# Patient Record
Sex: Female | Born: 1960 | Race: Black or African American | Hispanic: No | Marital: Single | Smoking: Never smoker
Health system: Southern US, Community
[De-identification: ages and names within clinical notes are randomized; demographics above are authoritative.]

## PROBLEM LIST (undated history)

## (undated) DIAGNOSIS — K209 Esophagitis, unspecified without bleeding: Secondary | ICD-10-CM

## (undated) DIAGNOSIS — F32A Depression, unspecified: Secondary | ICD-10-CM

## (undated) DIAGNOSIS — I10 Essential (primary) hypertension: Secondary | ICD-10-CM

## (undated) HISTORY — PX: TOTAL HIP ARTHROPLASTY: SHX124

---

## 2000-04-27 ENCOUNTER — Emergency Department (HOSPITAL_COMMUNITY): Admission: EM | Admit: 2000-04-27 | Discharge: 2000-04-27 | Payer: Self-pay | Admitting: Emergency Medicine

## 2005-11-05 ENCOUNTER — Emergency Department: Payer: Self-pay | Admitting: Unknown Physician Specialty

## 2008-02-18 ENCOUNTER — Emergency Department: Payer: Self-pay | Admitting: Emergency Medicine

## 2009-08-16 ENCOUNTER — Encounter: Payer: Self-pay | Admitting: Orthopedic Surgery

## 2009-09-04 ENCOUNTER — Encounter: Payer: Self-pay | Admitting: Orthopedic Surgery

## 2009-10-04 ENCOUNTER — Encounter: Payer: Self-pay | Admitting: Orthopedic Surgery

## 2009-10-17 ENCOUNTER — Observation Stay: Payer: Self-pay | Admitting: Internal Medicine

## 2011-11-05 IMAGING — CT CT HEAD WITHOUT CONTRAST
1 of 2 series · 16 of 30 positions shown, 20 images · non-contrast
Comparison: none

REASON FOR EXAM: Headache
COMMENTS:

PROCEDURE:     CT  - CT HEAD WITHOUT CONTRAST  - October 19, 2009 [DATE]
RESULT:     Comparison:  None
TECHNIQUE: Multiple axial images from the foramen magnum to the vertex were
obtained without IV contrast.

[Series 2: without · axial · non-contrast · 0.41mm/px · z∈[+461,+591]mm · 16 of 30 slices shown, 20 images]
[im 2/30  brain]
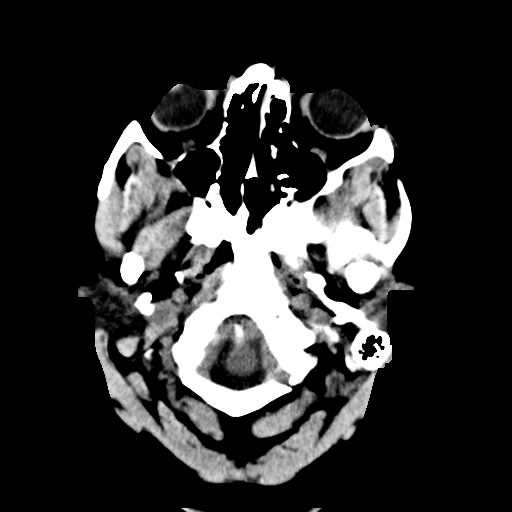
[im 2/30  bone]
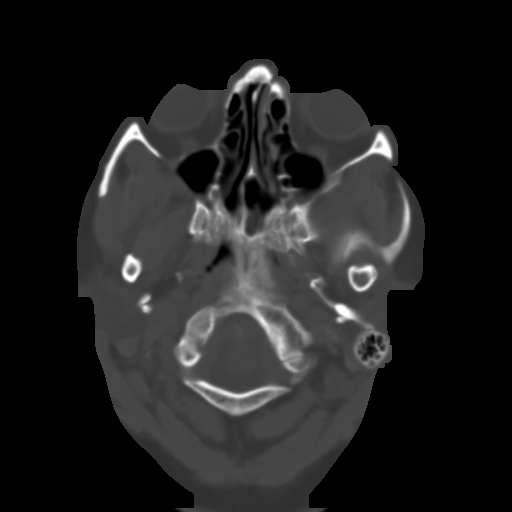
[im 4/30  brain]
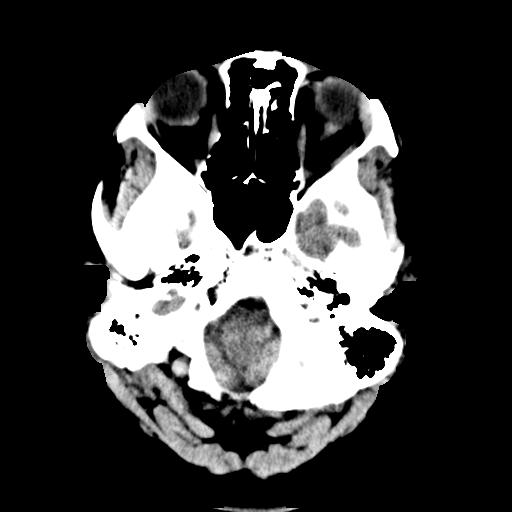
[im 5/30  brain]
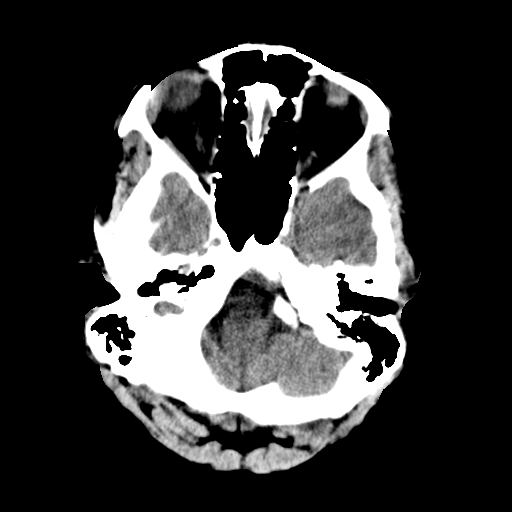
[im 7/30  brain]
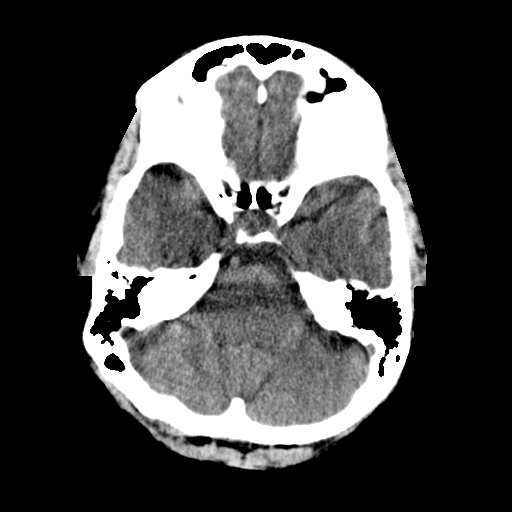
[im 9/30  brain]
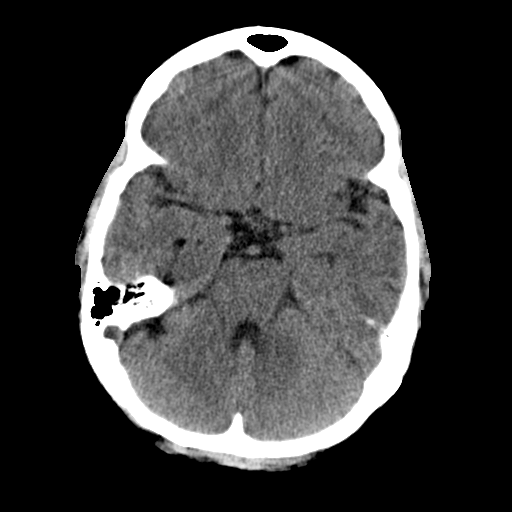
[im 9/30  bone]
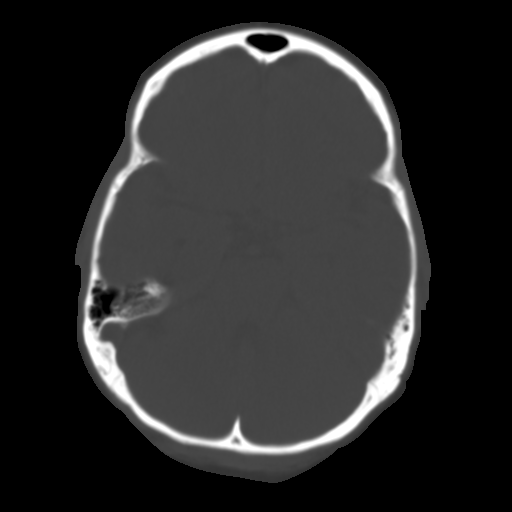
[im 10/30  brain]
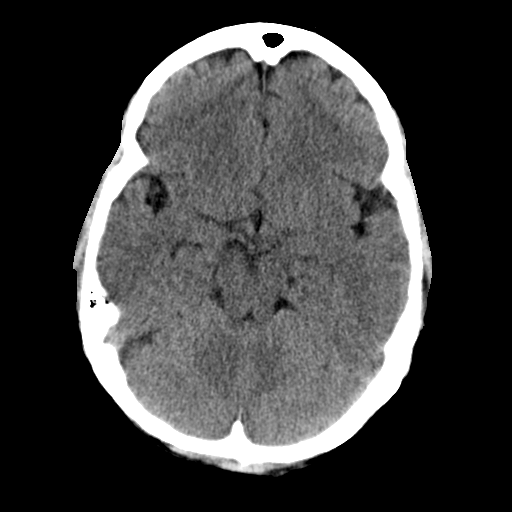
[im 12/30  brain]
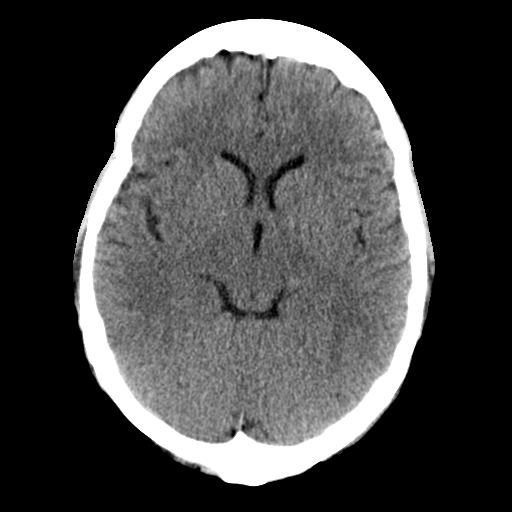
[im 13/30  brain]
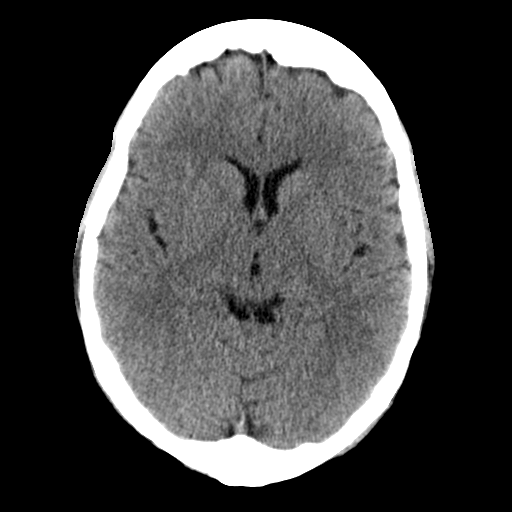
[im 17/30  brain]
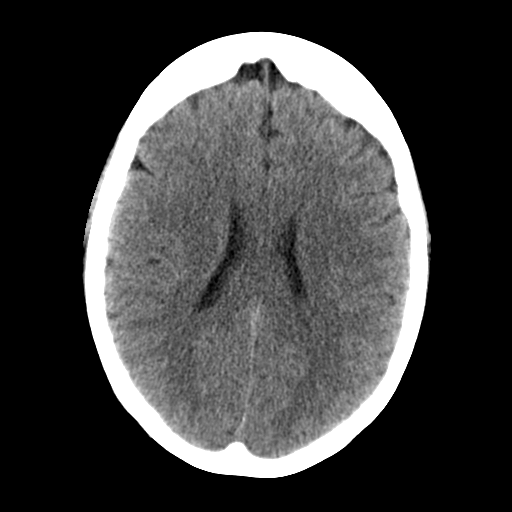
[im 17/30  bone]
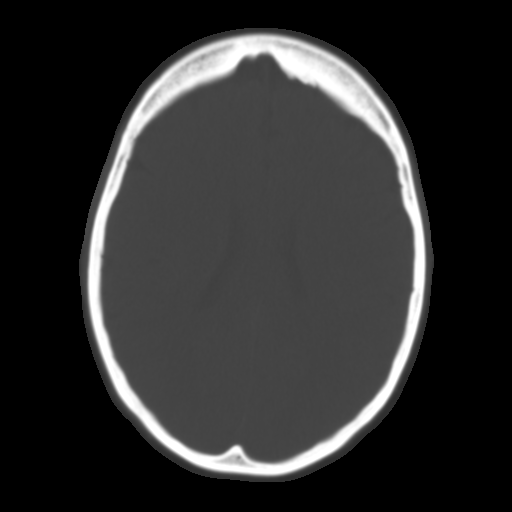
[im 18/30  brain]
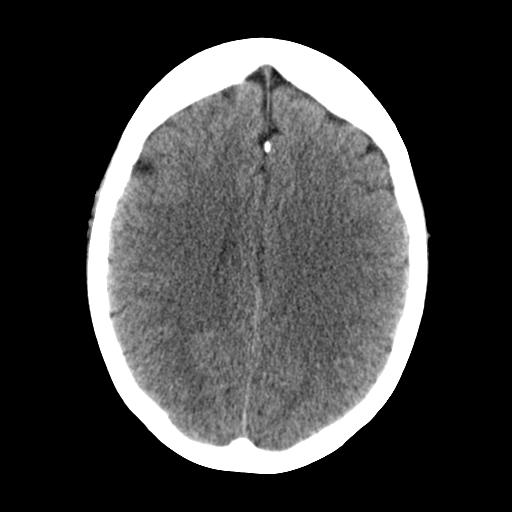
[im 20/30  brain]
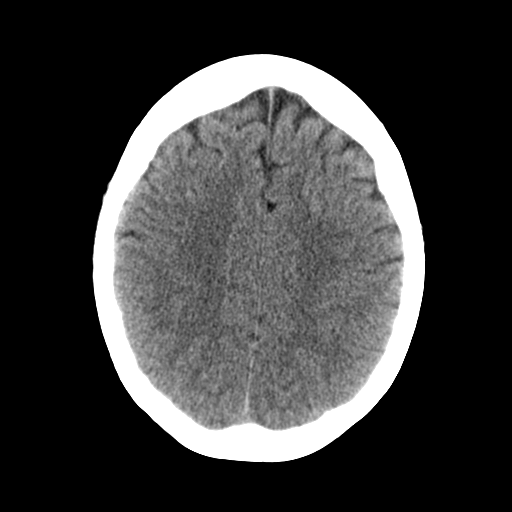
[im 21/30  brain]
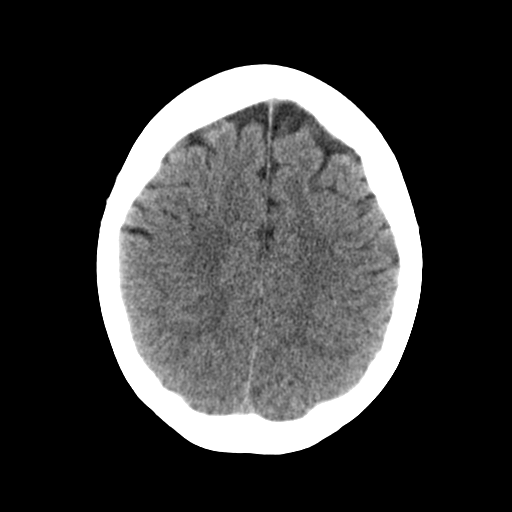
[im 23/30  brain]
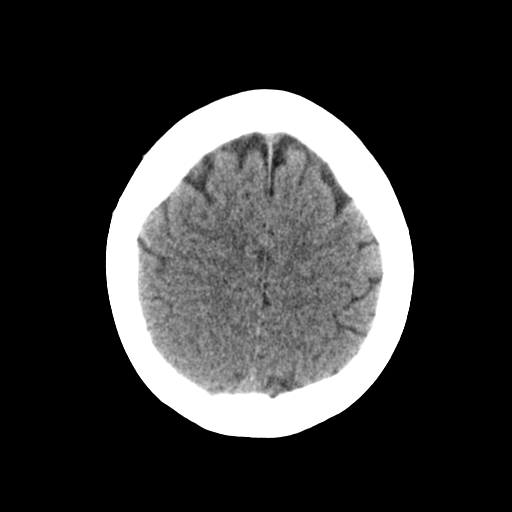
[im 23/30  bone]
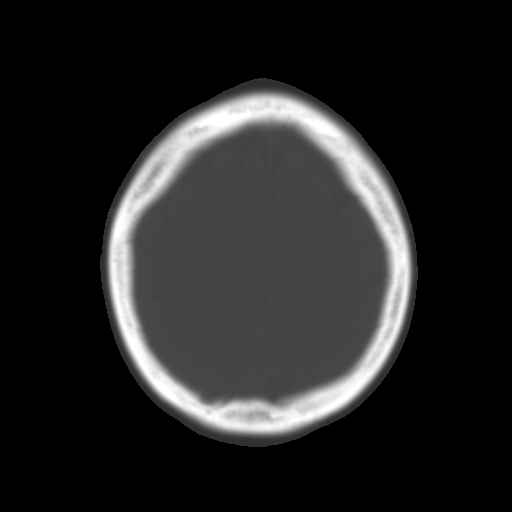
[im 25/30  brain]
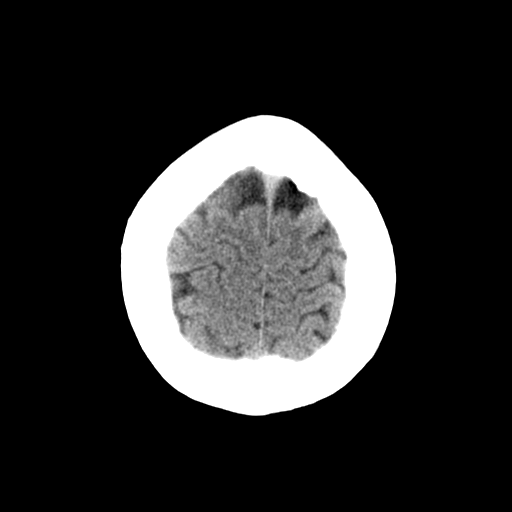
[im 26/30  brain]
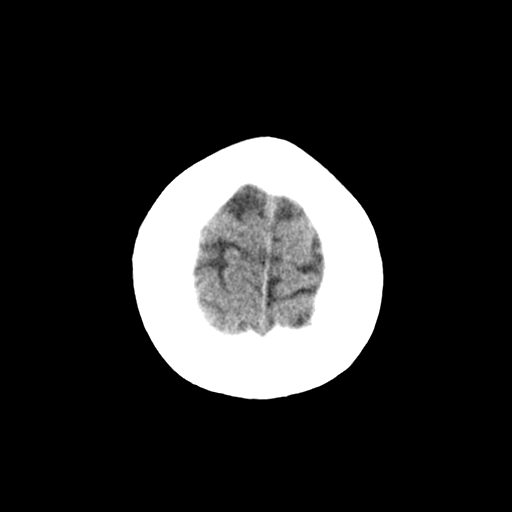
[im 28/30  brain]
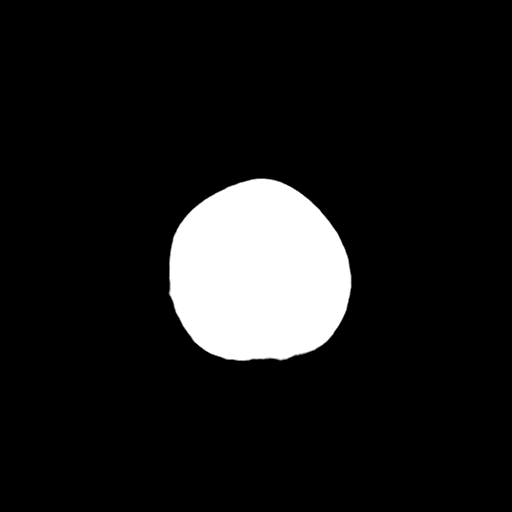

[16 of 30 positions shown; findings below may reference images not displayed]

FINDINGS: There is no evidence of mass effect, midline shift, or extra-axial fluid
collections.  There is no evidence of a space-occupying lesion or
intracranial hemorrhage. There is no evidence of a cortical-based area of
acute infarction.

The ventricles and sulci are appropriate for the patient's age. The basal
cisterns are patent.

Visualized portions of the orbits are unremarkable. The visualized portions
of the paranasal sinuses and mastoid air cells are unremarkable.

The osseous structures are unremarkable.
IMPRESSION: No acute intracranial process.

## 2012-05-05 ENCOUNTER — Emergency Department: Payer: Self-pay | Admitting: Unknown Physician Specialty

## 2012-05-05 LAB — CBC
HCT: 34.9 % — ABNORMAL LOW (ref 35.0–47.0)
HGB: 11.9 g/dL — ABNORMAL LOW (ref 12.0–16.0)
MCH: 34.1 pg — ABNORMAL HIGH (ref 26.0–34.0)
MCHC: 34.2 g/dL (ref 32.0–36.0)
MCV: 100 fL (ref 80–100)
Platelet: 291 10*3/uL (ref 150–440)
RBC: 3.49 10*6/uL — ABNORMAL LOW (ref 3.80–5.20)
WBC: 7.3 10*3/uL (ref 3.6–11.0)

## 2012-05-05 LAB — BASIC METABOLIC PANEL
BUN: 15 mg/dL (ref 7–18)
EGFR (African American): >60
EGFR (Non-African Amer.): >60
Osmolality: 276 (ref 275–301)
Potassium: 3 mmol/L — ABNORMAL LOW (ref 3.5–5.1)
Sodium: 138 mmol/L (ref 136–145)

## 2012-05-05 LAB — TROPONIN I: Troponin-I: <0.02 ng/mL

## 2015-06-05 ENCOUNTER — Emergency Department: Payer: BC Managed Care – PPO

## 2015-06-05 ENCOUNTER — Encounter: Payer: Self-pay | Admitting: *Deleted

## 2015-06-05 ENCOUNTER — Emergency Department
Admission: EM | Admit: 2015-06-05 | Discharge: 2015-06-05 | Disposition: A | Discharge status: Home / Self Care | Payer: BC Managed Care – PPO | Attending: Emergency Medicine | Admitting: Emergency Medicine

## 2015-06-05 ENCOUNTER — Other Ambulatory Visit: Payer: Self-pay

## 2015-06-05 DIAGNOSIS — Z79899 Other long term (current) drug therapy: Secondary | ICD-10-CM | POA: Diagnosis not present

## 2015-06-05 DIAGNOSIS — R079 Chest pain, unspecified: Secondary | ICD-10-CM

## 2015-06-05 DIAGNOSIS — I10 Essential (primary) hypertension: Secondary | ICD-10-CM | POA: Insufficient documentation

## 2015-06-05 DIAGNOSIS — M546 Pain in thoracic spine: Secondary | ICD-10-CM | POA: Insufficient documentation

## 2015-06-05 HISTORY — DX: Esophagitis, unspecified without bleeding: K20.90

## 2015-06-05 HISTORY — DX: Esophagitis, unspecified: K20.9

## 2015-06-05 HISTORY — DX: Essential (primary) hypertension: I10

## 2015-06-05 LAB — FIBRIN DERIVATIVES D-DIMER (ARMC ONLY): FIBRIN DERIVATIVES D-DIMER (ARMC): 583 — AB (ref 0–499)

## 2015-06-05 LAB — BASIC METABOLIC PANEL
ANION GAP: 10 (ref 5–15)
BUN: 15 mg/dL (ref 6–20)
CALCIUM: 9.2 mg/dL (ref 8.9–10.3)
CO2: 24 mmol/L (ref 22–32)
CREATININE: 0.78 mg/dL (ref 0.44–1.00)
Chloride: 103 mmol/L (ref 101–111)
GFR calc Af Amer: >60 mL/min (ref >60)
GFR calc non Af Amer: >60 mL/min (ref >60)
Glucose, Bld: 120 mg/dL — ABNORMAL HIGH (ref 65–99)
Potassium: 3.1 mmol/L — ABNORMAL LOW (ref 3.5–5.1)
Sodium: 137 mmol/L (ref 135–145)

## 2015-06-05 LAB — CBC
HEMATOCRIT: 37.8 % (ref 35.0–47.0)
Hemoglobin: 12.7 g/dL (ref 12.0–16.0)
MCH: 32.4 pg (ref 26.0–34.0)
MCHC: 33.5 g/dL (ref 32.0–36.0)
MCV: 96.8 fL (ref 80.0–100.0)
Platelets: 389 10*3/uL (ref 150–440)
RBC: 3.91 MIL/uL (ref 3.80–5.20)
RDW: 13.1 % (ref 11.5–14.5)
WBC: 7.5 10*3/uL (ref 3.6–11.0)

## 2015-06-05 LAB — TROPONIN I: Troponin I: <0.03 ng/mL (ref <0.031)

## 2015-06-05 MED ORDER — ASPIRIN 81 MG PO CHEW
324.0000 mg | CHEWABLE_TABLET | Freq: Once | ORAL | Status: AC
  Administered 2015-06-05: 162 mg via ORAL
  Filled 2015-06-05: qty 2

## 2015-06-05 MED ORDER — IOHEXOL 350 MG/ML SOLN
75.0000 mL | Freq: Once | INTRAVENOUS | Status: AC | PRN
  Administered 2015-06-05: 75 mL via INTRAVENOUS

## 2015-06-05 MED ORDER — SODIUM CHLORIDE 0.9 % IV BOLUS (SEPSIS): 1000.0000 mL | Freq: Once | INTRAVENOUS | Status: DC

## 2015-06-05 MED ORDER — SODIUM CHLORIDE 0.9 % IV BOLUS (SEPSIS)
500.0000 mL | Freq: Once | INTRAVENOUS | Status: AC
  Administered 2015-06-05: 500 mL via INTRAVENOUS

## 2015-06-05 MED ORDER — CYCLOBENZAPRINE HCL 10 MG PO TABS
10.0000 mg | ORAL_TABLET | Freq: Three times a day (TID) | ORAL | Status: DC | PRN
Start: 2015-06-05 — End: 2015-08-24

## 2015-06-05 NOTE — ED Notes (Signed)
Patient transported to CT 

## 2015-06-05 NOTE — ED Provider Notes (Signed)
[facility] Emergency Department Provider Note  ____________________________________________  Time seen: Approximately 550 PM  I have reviewed the triage vital signs and the nursing notes.   HISTORY  Chief Complaint Chest Pain    HPI Whitney Nichols is a 54 y.o. female with a history of hypertension and esophagitis who presents today with left-sided chest pain that started at 4 AM this morning. She describes the pain as a soreness in her left chest as well as the left thoracic back. She denies any heavy lifting or strenuous activity but says that the pain worsens with movement.  Denies any shortness of breath, diaphoresis, nausea or vomiting. Says has had the same type of pain in the past but has not been evaluated for it. Says that her history is pertinent for her mother who died at 50 suddenly from a massive heart attack.Chest pain is been constant since this morning and moderate. Denies any smoking. Says is on birth control.   Past Medical History  Diagnosis Date  . Hypertension   . Esophagitis     There are no active problems to display for this patient.   Past Surgical History  Procedure Laterality Date  . Total hip arthroplasty      Current Outpatient Rx  Name  Route  Sig  Dispense  Refill  . hydrochlorothiazide (MICROZIDE) 12.5 MG capsule   Oral   Take 12.5 mg by mouth daily.           Allergies Peanut butter flavor  No family history on file.  Social History History  Substance Use Topics  . Smoking status: Never Smoker   . Smokeless tobacco: Not on file  . Alcohol Use: Yes    Review of Systems Constitutional: No fever/chills Eyes: No visual changes. ENT: No sore throat. Cardiovascular: As above Respiratory: Denies shortness of breath. Gastrointestinal: No abdominal pain.  No nausea, no vomiting.  No diarrhea.  No constipation. Genitourinary: Negative for dysuria. Musculoskeletal: Negative for back pain. Skin:  Negative for rash. Neurological: Negative for headaches, focal weakness or numbness.  10-point ROS otherwise negative.  ____________________________________________   PHYSICAL EXAM:  VITAL SIGNS: ED Triage Vitals  Enc Vitals Group     BP 06/05/15 1623 139/85 mmHg     Pulse Rate 06/05/15 1614 108     Resp 06/05/15 1614 16     Temp 06/05/15 1614 98.2 F (36.8 C)     Temp Source 06/05/15 1614 Oral     SpO2 06/05/15 1614 98 %     Weight 06/05/15 1614 200 lb (90.719 kg)     Height 06/05/15 1614  (1.702 m)     Head Cir --      Peak Flow --      Pain Score 06/05/15 1614 6     Pain Loc --      Pain Edu? --      Excl. in GC? --     Constitutional: Alert and oriented. Well appearing and in no acute distress. Eyes: Conjunctivae are normal. PERRL. EOMI. Head: Atraumatic. Nose: No congestion/rhinnorhea. Mouth/Throat: Mucous membranes are moist.  Oropharynx non-erythematous. Neck: No stridor.   Cardiovascular: Normal rate, regular rhythm. Grossly normal heart sounds.  Good peripheral circulation. Chest pain reproducible on palpation. Equal and bilateral radial as well as dorsalis pedis pulses. Respiratory: Normal respiratory effort.  No retractions. Lungs CTAB. Gastrointestinal: Soft and nontender. No distention. No abdominal bruits. No CVA tenderness. Musculoskeletal: No lower extremity tenderness nor edema.  No  joint effusions. Neurologic:  Normal speech and language. No gross focal neurologic deficits are appreciated. No gait instability. Skin:  Skin is warm, dry and intact. No rash noted. Psychiatric: Mood and affect are normal. Speech and behavior are normal.  ____________________________________________   LABS (all labs ordered are listed, but only abnormal results are displayed)  Labs Reviewed  BASIC METABOLIC PANEL - Abnormal; Notable for the following:    Potassium 3.1 (*)    Glucose, Bld 120 (*)    All other components within normal limits  FIBRIN DERIVATIVES  D-DIMER ([facility] ONLY) - Abnormal; Notable for the following:    Fibrin derivatives D-dimer ([facility]) 583 (*)    All other components within normal limits  CBC  TROPONIN I   ____________________________________________  EKG  ED ECG REPORT I, Arelia Longest, the attending physician, personally viewed and interpreted this ECG.   Date: 06/05/2015  EKG Time: 1615  Rate: 106  Rhythm: sinus tachycardia  Axis: Normal axis  Intervals:none  ST&T Change: T-wave inversion in aVF which is unchanged from previous EKG from 05/05/2012. Poor baseline in V3 through V6. Possible new T-wave inversions. We'll repeat EKG.  ED ECG REPORT I, Arelia Longest, the attending physician, personally viewed and interpreted this ECG.   Date: 06/05/2015  EKG Time: 1821  Rate: 67  Rhythm: normal sinus rhythm  Axis: Normal axis  Intervals:none  ST&T Change: T wave inversions in 3 and aVF. Also with T wave inversion in V3. Better baseline than previous EKG taken during this visit. More similar to previous EKGs from 2013. V3 T-wave inversion is new. No ST segment elevations or depressions.  ____________________________________________  RADIOLOGY  No active cardiopulmonary disease. I personally reviewed the chest x-ray.  Negative CT angiography of the chest. ____________________________________________   PROCEDURES    ____________________________________________   INITIAL IMPRESSION / ASSESSMENT AND PLAN / ED COURSE  Pertinent labs & imaging results that were available during my care of the patient were reviewed by me and considered in my medical decision making (see chart for details).  ----------------------------------------- 8:30 PM on 06/05/2015 -----------------------------------------  Patient resting comfortably at this time. Says pain has decreased after aspirin. Chest pain is atypical and has characteristics of musculoskeletal pain. However, due to nonspecific repolarization  abnormalities on the EKG as well as a family history of cardiac disease I feel the patient should follow-up in the office with the cardiologist. I spoke with Dr. Park Breed, who is able to see the patient at 11 AM tomorrow morning. I discussed this with the patient and she says she is able to make the appointment. She will be given a doctor's note to excuse her from work. Heart score of 3 which makes the patient low risk.  ____________________________________________   FINAL CLINICAL IMPRESSION(S) / ED DIAGNOSES  Acute chest pain. Initial visit.    Myrna Blazer, MD 06/05/15 2031

## 2015-06-05 NOTE — ED Notes (Signed)

## 2015-06-05 NOTE — Discharge Instructions (Signed)

## 2015-06-05 NOTE — ED Notes (Signed)
Pt states she was resting last night and had onset of a "soreness" in her chest that has been constant since then. She also feels pain up through the left shoulder pain, associated with nausea. Pt also states that she has had a cough for several days.

## 2015-08-23 ENCOUNTER — Observation Stay
Admission: EM | Admit: 2015-08-23 | Discharge: 2015-08-24 | Disposition: A | Discharge status: Home / Self Care | Payer: BC Managed Care – PPO | Attending: Internal Medicine | Admitting: Internal Medicine

## 2015-08-23 ENCOUNTER — Encounter: Payer: Self-pay | Admitting: *Deleted

## 2015-08-23 DIAGNOSIS — Z9102 Food additives allergy status: Secondary | ICD-10-CM | POA: Diagnosis not present

## 2015-08-23 DIAGNOSIS — Y929 Unspecified place or not applicable: Secondary | ICD-10-CM | POA: Insufficient documentation

## 2015-08-23 DIAGNOSIS — T783XXA Angioneurotic edema, initial encounter: Principal | ICD-10-CM | POA: Diagnosis present

## 2015-08-23 DIAGNOSIS — Z79899 Other long term (current) drug therapy: Secondary | ICD-10-CM | POA: Insufficient documentation

## 2015-08-23 DIAGNOSIS — Z7982 Long term (current) use of aspirin: Secondary | ICD-10-CM | POA: Insufficient documentation

## 2015-08-23 DIAGNOSIS — Y939 Activity, unspecified: Secondary | ICD-10-CM | POA: Diagnosis not present

## 2015-08-23 DIAGNOSIS — X58XXXA Exposure to other specified factors, initial encounter: Secondary | ICD-10-CM | POA: Diagnosis not present

## 2015-08-23 DIAGNOSIS — R1310 Dysphagia, unspecified: Secondary | ICD-10-CM | POA: Diagnosis not present

## 2015-08-23 DIAGNOSIS — I10 Essential (primary) hypertension: Secondary | ICD-10-CM | POA: Insufficient documentation

## 2015-08-23 LAB — COMPREHENSIVE METABOLIC PANEL
ALT: 28 U/L (ref 14–54)
AST: 27 U/L (ref 15–41)
Albumin: 4.1 g/dL (ref 3.5–5.0)
Alkaline Phosphatase: 59 U/L (ref 38–126)
Anion gap: 6 (ref 5–15)
BILIRUBIN TOTAL: 0.5 mg/dL (ref 0.3–1.2)
BUN: 12 mg/dL (ref 6–20)
CO2: 24 mmol/L (ref 22–32)
Calcium: 9.2 mg/dL (ref 8.9–10.3)
Chloride: 106 mmol/L (ref 101–111)
Creatinine, Ser: 0.79 mg/dL (ref 0.44–1.00)
GFR calc Af Amer: >60 mL/min (ref >60)
Glucose, Bld: 111 mg/dL — ABNORMAL HIGH (ref 65–99)
Potassium: 3.6 mmol/L (ref 3.5–5.1)
Sodium: 136 mmol/L (ref 135–145)
TOTAL PROTEIN: 8 g/dL (ref 6.5–8.1)

## 2015-08-23 LAB — CBC
HEMATOCRIT: 36.2 % (ref 35.0–47.0)
Hemoglobin: 12 g/dL (ref 12.0–16.0)
MCH: 32.1 pg (ref 26.0–34.0)
MCHC: 33.1 g/dL (ref 32.0–36.0)
MCV: 97 fL (ref 80.0–100.0)
Platelets: 387 10*3/uL (ref 150–440)
RBC: 3.73 MIL/uL — ABNORMAL LOW (ref 3.80–5.20)
RDW: 13.9 % (ref 11.5–14.5)
WBC: 10 10*3/uL (ref 3.6–11.0)

## 2015-08-23 LAB — PROTIME-INR
INR: 0.9
Prothrombin Time: 12.4 seconds (ref 11.4–15.0)

## 2015-08-23 MED ORDER — FAMOTIDINE IN NACL 20-0.9 MG/50ML-% IV SOLN
20.0000 mg | Freq: Once | INTRAVENOUS | Status: AC
  Administered 2015-08-23: 20 mg via INTRAVENOUS
  Filled 2015-08-23: qty 50

## 2015-08-23 MED ORDER — METHYLPREDNISOLONE SODIUM SUCC 125 MG IJ SOLR
INTRAMUSCULAR | Status: AC
  Administered 2015-08-23: 125 mg via INTRAVENOUS
  Filled 2015-08-23: qty 2

## 2015-08-23 MED ORDER — EPINEPHRINE 0.3 MG/0.3ML IJ SOAJ: 0.3000 mg | Freq: Once | INTRAMUSCULAR | Status: AC

## 2015-08-23 MED ORDER — DIPHENHYDRAMINE HCL 50 MG/ML IJ SOLN
INTRAMUSCULAR | Status: AC
  Administered 2015-08-23: 50 mg via INTRAVENOUS
  Filled 2015-08-23: qty 1

## 2015-08-23 MED ORDER — METHYLPREDNISOLONE SODIUM SUCC 125 MG IJ SOLR
125.0000 mg | Freq: Once | INTRAMUSCULAR | Status: AC
  Administered 2015-08-23: 125 mg via INTRAVENOUS

## 2015-08-23 MED ORDER — EPINEPHRINE HCL 1 MG/ML IJ SOLN
INTRAMUSCULAR | Status: AC
  Administered 2015-08-23: 0.3 mg
  Filled 2015-08-23: qty 1

## 2015-08-23 MED ORDER — DIPHENHYDRAMINE HCL 50 MG/ML IJ SOLN
50.0000 mg | Freq: Once | INTRAMUSCULAR | Status: AC
  Administered 2015-08-23: 50 mg via INTRAVENOUS

## 2015-08-23 NOTE — ED Notes (Signed)
Pt used a seasoning on a porkchop and chicken.  Tongue began swelling and itching.  Pt has diff swallowing.

## 2015-08-24 ENCOUNTER — Encounter: Payer: Self-pay | Admitting: Internal Medicine

## 2015-08-24 DIAGNOSIS — T783XXA Angioneurotic edema, initial encounter: Secondary | ICD-10-CM | POA: Diagnosis present

## 2015-08-24 LAB — CBC
HEMATOCRIT: 34.5 % — AB (ref 35.0–47.0)
Hemoglobin: 11.7 g/dL — ABNORMAL LOW (ref 12.0–16.0)
MCH: 33.2 pg (ref 26.0–34.0)
MCHC: 33.8 g/dL (ref 32.0–36.0)
MCV: 98.1 fL (ref 80.0–100.0)
Platelets: 342 10*3/uL (ref 150–440)
RBC: 3.51 MIL/uL — ABNORMAL LOW (ref 3.80–5.20)
RDW: 14 % (ref 11.5–14.5)
WBC: 8.9 10*3/uL (ref 3.6–11.0)

## 2015-08-24 LAB — CREATININE, SERUM
Creatinine, Ser: 0.79 mg/dL (ref 0.44–1.00)
GFR calc non Af Amer: >60 mL/min (ref >60)

## 2015-08-24 MED ORDER — FENTANYL CITRATE (PF) 100 MCG/2ML IJ SOLN
25.0000 ug | Freq: Once | INTRAMUSCULAR | Status: AC
  Administered 2015-08-24: 25 ug via INTRAVENOUS

## 2015-08-24 MED ORDER — ONDANSETRON HCL 4 MG PO TABS: 4.0000 mg | ORAL_TABLET | Freq: Four times a day (QID) | ORAL | Status: DC | PRN

## 2015-08-24 MED ORDER — ONDANSETRON HCL 4 MG/2ML IJ SOLN: 4.0000 mg | Freq: Four times a day (QID) | INTRAMUSCULAR | Status: DC | PRN

## 2015-08-24 MED ORDER — ACETAMINOPHEN 325 MG PO TABS
650.0000 mg | ORAL_TABLET | Freq: Four times a day (QID) | ORAL | Status: DC | PRN
  Administered 2015-08-24: 650 mg via ORAL
  Filled 2015-08-24 (×2): qty 2

## 2015-08-24 MED ORDER — HYDROCHLOROTHIAZIDE 12.5 MG PO CAPS
12.5000 mg | ORAL_CAPSULE | Freq: Every day | ORAL | Status: DC
  Administered 2015-08-24: 12.5 mg via ORAL
  Filled 2015-08-24: qty 1

## 2015-08-24 MED ORDER — BUTALBITAL-APAP-CAFFEINE 50-325-40 MG PO TABS
1.0000 | ORAL_TABLET | Freq: Once | ORAL | Status: AC
  Administered 2015-08-24: 1 via ORAL
  Filled 2015-08-24: qty 1

## 2015-08-24 MED ORDER — DIPHENHYDRAMINE HCL 50 MG/ML IJ SOLN: 50.0000 mg | Freq: Four times a day (QID) | INTRAMUSCULAR | Status: DC | PRN

## 2015-08-24 MED ORDER — CYCLOBENZAPRINE HCL 10 MG PO TABS: 10.0000 mg | ORAL_TABLET | Freq: Three times a day (TID) | ORAL | Status: DC | PRN

## 2015-08-24 MED ORDER — FENTANYL CITRATE (PF) 100 MCG/2ML IJ SOLN: 12.5000 ug | Freq: Once | INTRAMUSCULAR | Status: DC

## 2015-08-24 MED ORDER — FENTANYL CITRATE (PF) 100 MCG/2ML IJ SOLN
INTRAMUSCULAR | Status: AC
  Administered 2015-08-24: 25 ug via INTRAVENOUS
  Filled 2015-08-24: qty 2

## 2015-08-24 MED ORDER — ONDANSETRON HCL 4 MG/2ML IJ SOLN
4.0000 mg | Freq: Once | INTRAMUSCULAR | Status: AC
  Administered 2015-08-24: 4 mg via INTRAVENOUS

## 2015-08-24 MED ORDER — OXYCODONE HCL 5 MG PO TABS: 5.0000 mg | ORAL_TABLET | ORAL | Status: DC | PRN

## 2015-08-24 MED ORDER — MORPHINE SULFATE (PF) 2 MG/ML IV SOLN
2.0000 mg | INTRAVENOUS | Status: DC | PRN
  Administered 2015-08-24 (×2): 2 mg via INTRAVENOUS
  Filled 2015-08-24 (×2): qty 1

## 2015-08-24 MED ORDER — ONDANSETRON HCL 4 MG/2ML IJ SOLN
INTRAMUSCULAR | Status: AC
  Administered 2015-08-24: 4 mg via INTRAVENOUS
  Filled 2015-08-24: qty 2

## 2015-08-24 MED ORDER — EPINEPHRINE 0.3 MG/0.3ML IJ SOAJ: 0.3000 mg | Freq: Once | INTRAMUSCULAR | Status: AC

## 2015-08-24 MED ORDER — METHYLPREDNISOLONE SODIUM SUCC 125 MG IJ SOLR
60.0000 mg | INTRAMUSCULAR | Status: DC
  Filled 2015-08-24: qty 2

## 2015-08-24 MED ORDER — HEPARIN SODIUM (PORCINE) 5000 UNIT/ML IJ SOLN
5000.0000 [IU] | Freq: Three times a day (TID) | INTRAMUSCULAR | Status: DC
  Administered 2015-08-24: 5000 [IU] via SUBCUTANEOUS
  Filled 2015-08-24: qty 1

## 2015-08-24 MED ORDER — ACETAMINOPHEN 650 MG RE SUPP: 650.0000 mg | Freq: Four times a day (QID) | RECTAL | Status: DC | PRN

## 2015-08-24 NOTE — Progress Notes (Signed)
sw Dr Elisabeth PigeonVachhani concerning diet orders received

## 2015-08-24 NOTE — H&P (Signed)
[facility] [place] [facility]   PATIENT NAME: Whitney BankerJoyce Creeden    MR#:  [id number]  DATE OF BIRTH:  12-14-1960   DATE OF ADMISSION:  08/23/2015  PRIMARY CARE PHYSICIAN: Elvera Lennoxhristopher Claude Felton   REQUESTING/REFERRING PHYSICIAN: Manson PasseyBrown  CHIEF COMPLAINT:   Chief Complaint  Patient presents with  . Allergic Reaction    HISTORY OF PRESENT ILLNESS:  Whitney Nichols  is a 54 y.o. female with a known history of essential hypertension presenting after an allergic reaction. She states that eating her dinner prepared with "seasoning salt" she began to feel that her tongue was swelling and she also had some difficulty swallowing. States that she was able to tolerate her secretions however given her mild symptoms with tongue swelling she presented to the Hospital for further workup and evaluation. In the emergency department she was treated appropriately for an allergic reaction versus symptoms have improved somewhat but still present.  PAST MEDICAL HISTORY:   Past Medical History  Diagnosis Date  . Hypertension   . Esophagitis     PAST SURGICAL HISTORY:   Past Surgical History  Procedure Laterality Date  . Total hip arthroplasty      SOCIAL HISTORY:   Social History  Substance Use Topics  . Smoking status: Never Smoker   . Smokeless tobacco: Not on file  . Alcohol Use: Yes    FAMILY HISTORY:   Family History  Problem Relation Age of Onset  . Hypertension Other     DRUG ALLERGIES:   Allergies  Allergen Reactions  . Peanut Butter Flavor     REVIEW OF SYSTEMS:  REVIEW OF SYSTEMS:  CONSTITUTIONAL: Denies fevers, chills, fatigue, weakness.  EYES: Denies blurred vision, double vision, or eye pain.  EARS, NOSE, THROAT: Denies tinnitus, ear pain, hearing loss. Positive difficulty swallowing RESPIRATORY: denies cough, shortness of breath, wheezing  CARDIOVASCULAR: Denies chest pain, palpitations, edema.  GASTROINTESTINAL: Denies nausea,  vomiting, diarrhea, abdominal pain.  GENITOURINARY: Denies dysuria, hematuria.  ENDOCRINE: Denies nocturia or thyroid problems. HEMATOLOGIC AND LYMPHATIC: Denies easy bruising or bleeding.  SKIN: Denies rash or lesions.  MUSCULOSKELETAL: Denies pain in neck, back, shoulder, knees, hips, or further arthritic symptoms.  NEUROLOGIC: Denies paralysis, paresthesias.  PSYCHIATRIC: Denies anxiety or depressive symptoms. Otherwise full review of systems performed by me is negative.   MEDICATIONS AT HOME:   Prior to Admission medications   Medication Sig Start Date End Date Taking? Authorizing Provider  cyclobenzaprine (FLEXERIL) 10 MG tablet Take 1 tablet (10 mg total) by mouth 3 (three) times daily as needed for muscle spasms. 06/05/15   Myrna Blazeravid Matthew Schaevitz, MD  hydrochlorothiazide (MICROZIDE) 12.5 MG capsule Take 12.5 mg by mouth daily.    Historical Provider, MD      VITAL SIGNS:  Blood pressure 155/93, pulse 102, temperature 98.1 F (36.7 C), temperature source Oral, resp. rate 20, height 5\' 7"  (1.702 m), weight 210 lb (95.255 kg), last menstrual period 07/24/2015, SpO2 100 %.  PHYSICAL EXAMINATION:  VITAL SIGNS: Filed Vitals:   08/24/15 0000  BP: 155/93  Pulse: 102  Temp:   Resp:    GENERAL:53 y.o.female currently in mild acute distress.  HEAD: Normocephalic, atraumatic.  EYES: Pupils equal, round, reactive to light. Extraocular muscles intact. No scleral icterus.  MOUTH: Moist mucosal membrane. Dentition intact. No abscess noted. Macroglossia still able to visualize back of throat EAR, NOSE, THROAT: Clear without exudates. No external lesions.  NECK: Supple. No thyromegaly. No nodules. No JVD. No stridor PULMONARY:  Clear to ascultation, without wheeze rails or rhonci. No use of accessory muscles, Good respiratory effort. good air entry bilaterally CHEST: Nontender to palpation.  CARDIOVASCULAR: S1 and S2. Regular rate and rhythm. No murmurs, rubs, or gallops. No edema. Pedal  pulses 2+ bilaterally.  GASTROINTESTINAL: Soft, nontender, nondistended. No masses. Positive bowel sounds. No hepatosplenomegaly.  MUSCULOSKELETAL: No swelling, clubbing, or edema. Range of motion full in all extremities.  NEUROLOGIC: Cranial nerves II through XII are intact. No gross focal neurological deficits. Sensation intact. Reflexes intact.  SKIN: No ulceration, lesions, rashes, or cyanosis. Skin warm and dry. Turgor intact.  PSYCHIATRIC: Mood, affect within normal limits. The patient is awake, alert and oriented x 3. Insight, judgment intact.    LABORATORY PANEL:   CBC  Recent Labs Lab 08/23/15 2315  WBC 10.0  HGB 12.0  HCT 36.2  PLT 387   ------------------------------------------------------------------------------------------------------------------  Chemistries   Recent Labs Lab 08/23/15 2315  NA 136  K 3.6  CL 106  CO2 24  GLUCOSE 111*  BUN 12  CREATININE 0.79  CALCIUM 9.2  AST 27  ALT 28  ALKPHOS 59  BILITOT 0.5   ------------------------------------------------------------------------------------------------------------------  Cardiac Enzymes No results for input(s): TROPONINI in the last 168 hours. ------------------------------------------------------------------------------------------------------------------  RADIOLOGY:  No results found.  EKG:   Orders placed or performed during the hospital encounter of 06/05/15  . ED EKG within 10 minutes  . ED EKG within 10 minutes  . EKG    IMPRESSION AND PLAN:   54 year old African-American female history of essential hypertension presenting with allergic reaction  1 angioedema/allergic reaction: Received appropriate treatment, continue steroids/Benadryl, continue to monitor patient will require EpiPen for home usage upon discharge 2. Essential hypertension hydrochlorothiazide 3. Venous herbal embolism prophylactic: Heparin subcutaneous    All the records are reviewed and case discussed with  ED provider. Management plans discussed with the patient, family and they are in agreement.  CODE STATUS: Full  TOTAL TIME TAKING CARE OF THIS PATIENT: 35 minutes.    Zadok Holaway,  Mardi Mainland.D on 08/24/2015 at 12:52 AM  Between 7am to 6pm - Pager - [phone]  After 6pm: House Pager: - [phone]  [facility]  Office  [phone]  CC: Primary care physician; Elvera Lennox

## 2015-08-24 NOTE — ED Provider Notes (Signed)
[facility] Emergency Department Provider Note  ____________________________________________  Time seen: 11:30 PM  I have reviewed the triage vital signs and the nursing notes.   HISTORY  Chief Complaint Allergic Reaction      HPI Whitney Nichols is a 54 y.o. female presents with acute onset of tongue swelling accompanied by difficulty swallowing. Patient however denies any difficulty breathing. Patient states that she's had episodes in the past that are similar to this however not to this severity. She has never seen a physician regarding these symptoms before. Patient able to speak in clearfull sentences     Past Medical History  Diagnosis Date  . Hypertension   . Esophagitis     Patient Active Problem List   Diagnosis Date Noted  . Angioedema 08/24/2015    Past Surgical History  Procedure Laterality Date  . Total hip arthroplasty      Current Outpatient Rx  Name  Route  Sig  Dispense  Refill  . cyclobenzaprine (FLEXERIL) 10 MG tablet   Oral   Take 1 tablet (10 mg total) by mouth 3 (three) times daily as needed for muscle spasms.   15 tablet   1   . hydrochlorothiazide (MICROZIDE) 12.5 MG capsule   Oral   Take 12.5 mg by mouth daily.           Allergies Peanut butter flavor  Family History  Problem Relation Age of Onset  . Hypertension Other     Social History Social History  Substance Use Topics  . Smoking status: Never Smoker   . Smokeless tobacco: None  . Alcohol Use: Yes    Review of Systems  Constitutional: Negative for fever. Eyes: Negative for visual changes. ENT: Negative for sore throat. Positive for tongue swelling Cardiovascular: Negative for chest pain. Respiratory: Negative for shortness of breath. Gastrointestinal: Negative for abdominal pain, vomiting and diarrhea. Genitourinary: Negative for dysuria. Musculoskeletal: Negative for back pain. Skin: Negative for rash. Neurological: Negative for  headaches, focal weakness or numbness.   10-point ROS otherwise negative.  ____________________________________________   PHYSICAL EXAM:  VITAL SIGNS: ED Triage Vitals  Enc Vitals Group     BP 08/23/15 2304 152/96 mmHg     Pulse Rate 08/23/15 2304 104     Resp 08/23/15 2304 20     Temp 08/23/15 2304 98.1 F (36.7 C)     Temp Source 08/23/15 2304 Oral     SpO2 08/23/15 2304 97 %     Weight 08/23/15 2304 210 lb (95.255 kg)     Height 08/23/15 2304  (1.702 m)     Head Cir --      Peak Flow --      Pain Score 08/23/15 2305 10     Pain Loc --      Pain Edu? --      Excl. in GC? --      Constitutional: Alert and oriented. Well appearing and in no distress. Eyes: Conjunctivae are normal. PERRL. Normal extraocular movements. ENT   Head: Normocephalic and atraumatic.   Nose: No congestion/rhinnorhea.   Mouth/Throat: Mucous membranes are moist. Markedly swollen tongue with Mallampati score of 4   Neck: No stridor. Hematological/Lymphatic/Immunilogical: No cervical lymphadenopathy. Cardiovascular: Normal rate, regular rhythm. Normal and symmetric distal pulses are present in all extremities. No murmurs, rubs, or gallops. Respiratory: Normal respiratory effort without tachypnea nor retractions. Breath sounds are clear and equal bilaterally. No wheezes/rales/rhonchi. Gastrointestinal: Soft and nontender. No distention. There is no CVA  tenderness. Genitourinary: deferred Musculoskeletal: Nontender with normal range of motion in all extremities. No joint effusions.  No lower extremity tenderness nor edema. Neurologic:  Normal speech and language. No gross focal neurologic deficits are appreciated. Speech is normal.  Skin:  Skin is warm, dry and intact. No rash noted. Psychiatric: Mood and affect are normal. Speech and behavior are normal. Patient exhibits appropriate insight and judgment.  ____________________________________________    LABS (pertinent  positives/negatives)  Labs Reviewed  CBC - Abnormal; Notable for the following:    RBC 3.73 (*)    All other components within normal limits  COMPREHENSIVE METABOLIC PANEL - Abnormal; Notable for the following:    Glucose, Bld 111 (*)    All other components within normal limits  PROTIME-INR  CBC  CREATININE, SERUM  BASIC METABOLIC PANEL  CBC         Critical Care performed: CRITICAL CARE Performed by: Bayard MalesBROWN, [place]   Total critical care time: 60minutes  Critical care time was exclusive of separately billable procedures and treating other patients.  Critical care was necessary to treat or prevent imminent or life-threatening deterioration.  Critical care was time spent personally by me on the following activities: development of treatment plan with patient and/or surrogate as well as nursing, discussions with consultants, evaluation of patient's response to treatment, examination of patient, obtaining history from patient or surrogate, ordering and performing treatments and interventions, ordering and review of laboratory studies, ordering and review of radiographic studies, pulse oximetry and re-evaluation of patient's condition.   ____________________________________________   INITIAL IMPRESSION / ASSESSMENT AND PLAN / ED COURSE  Pertinent labs & imaging results that were available during my care of the patient were reviewed by me and considered in my medical decision making (see chart for details).  Patient received epinephrine 0.3 mg IM as well as Benadryl 50 mg Solu-Medrol 125 mg and Pepcid 20mg  IV.  Approximately 30 minutes after administration of medication patient was reassessed and state that her tongue felt the same however had not worsened. Patient still denies any difficulty breathing however still continues to have difficulty swallowing. Patient still managing oral secretions no active drooling at this time. Patient discussed with Dr. Clint GuyHower for hospital  admission for further evaluation and management.  ____________________________________________   FINAL CLINICAL IMPRESSION(S) / ED DIAGNOSES  Final diagnoses:  Angioedema, initial encounter      Darci Currentandolph N Brantly Kalman, MD 08/24/15 [phone]

## 2015-08-24 NOTE — ED Notes (Signed)
Patient resting comfortably. States no improvement at this time but no worsening symptoms. Neighbor at bedside.

## 2015-08-24 NOTE — Discharge Instructions (Signed)
Follow with PMD in 1-2 weeks, need to get a referral from PMD for Allergy specialist.

## 2015-08-24 NOTE — Discharge Summary (Signed)
[facility] [place] at [facility]   PATIENT NAME: Whitney Nichols    MR#:  [id number]  DATE OF BIRTH:  March 01, 1961  DATE OF ADMISSION:  08/23/2015 ADMITTING PHYSICIAN: Wyatt Haste, MD  DATE OF DISCHARGE: 08/24/2015  PRIMARY CARE PHYSICIAN: Elvera Lennox, DO    ADMISSION DIAGNOSIS:  Angioedema, initial encounter [T78.3XXA]  DISCHARGE DIAGNOSIS:  Principal Problem:   Angioedema   SECONDARY DIAGNOSIS:   Past Medical History  Diagnosis Date  . Hypertension   . Esophagitis     HOSPITAL COURSE:   54 year old African-American female history of essential hypertension presenting with allergic reaction 1 angioedema/allergic reaction: Received appropriate treatment, continue steroids/Benadryl, continue to monitor patient will require EpiPen for home usage upon discharge    Remained stable, swelling subsided and tolerated regular diet.    I educated about use of epi pen , and need to see an allergy specialist with help of PMD at [place].  2. Essential hypertension hydrochlorothiazide  DISCHARGE CONDITIONS:   Stable.  CONSULTS OBTAINED:  Treatment Team:  Wyatt Haste, MD  DRUG ALLERGIES:   Allergies  Allergen Reactions  . Peanut Butter Flavor     DISCHARGE MEDICATIONS:   Current Discharge Medication List    START taking these medications   Details  EPINEPHrine 0.3 mg/0.3 mL IJ SOAJ injection Inject 0.3 mLs (0.3 mg total) into the muscle once. Take injection by your self once, If start to have serious allergic reaction like hives, tongue swelling or trouble breathing. Then need to come to ER. Keep the pen with you all the time. Qty: 1 Device, Refills: 0      CONTINUE these medications which have NOT CHANGED   Details  aspirin EC 325 MG tablet Take 325 mg by mouth daily.    DULoxetine (CYMBALTA) 30 MG capsule Take 30 mg by mouth daily.    hydrochlorothiazide (HYDRODIURIL) 25 MG tablet Take 25 mg by mouth daily.     HYDROcodone-acetaminophen (NORCO) 7.5-325 MG tablet Take 1 tablet by mouth every 6 (six) hours as needed. for pain Refills: 0    ORSYTHIA 0.1-20 MG-MCG tablet Take 1 tablet by mouth daily. Refills: 3    pantoprazole (PROTONIX) 40 MG tablet Take 40 mg by mouth daily. Refills: 2    potassium chloride (KLOR-CON 10) 10 MEQ tablet Take 1 tablet by mouth daily.    Prenatal Vit-Fe Fumarate-FA (PRENATAL PO) Take 1 tablet by mouth daily.    traMADol (ULTRAM) 50 MG tablet Take 50 mg by mouth every 6 (six) hours as needed. for pain Refills: 0    valACYclovir (VALTREX) 500 MG tablet Take 500 mg by mouth daily.      STOP taking these medications     cyclobenzaprine (FLEXERIL) 10 MG tablet      hydrochlorothiazide (MICROZIDE) 12.5 MG capsule          DISCHARGE INSTRUCTIONS:    Follow with PMD in 1-2 weeks.  If you experience worsening of your admission symptoms, develop shortness of breath, life threatening emergency, suicidal or homicidal thoughts you must seek medical attention immediately by calling 911 or calling your MD immediately  if symptoms less severe.  You Must read complete instructions/literature along with all the possible adverse reactions/side effects for all the Medicines you take and that have been prescribed to you. Take any new Medicines after you have completely understood and accept all the possible adverse reactions/side effects.   Please note  You were cared for by a hospitalist  during your hospital stay. If you have any questions about your discharge medications or the care you received while you were in the hospital after you are discharged, you can call the unit and asked to speak with the hospitalist on call if the hospitalist that took care of you is not available. Once you are discharged, your primary care physician will handle any further medical issues. Please note that NO REFILLS for any discharge medications will be authorized once you are discharged, as  it is imperative that you return to your primary care physician (or establish a relationship with a primary care physician if you do not have one) for your aftercare needs so that they can reassess your need for medications and monitor your lab values.    Today   CHIEF COMPLAINT:   Chief Complaint  Patient presents with  . Allergic Reaction    HISTORY OF PRESENT ILLNESS:  Whitney Nichols  is a 54 y.o. female with a known history of essential hypertension presenting after an allergic reaction. She states that eating her dinner prepared with "seasoning salt" she began to feel that her tongue was swelling and she also had some difficulty swallowing. States that she was able to tolerate her secretions however given her mild symptoms with tongue swelling she presented to the Hospital for further workup and evaluation. In the emergency department she was treated appropriately for an allergic reaction versus symptoms have improved somewhat but still present.   VITAL SIGNS:  Blood pressure 134/89, pulse 90, temperature 98.1 F (36.7 C), temperature source Oral, resp. rate 18, height  (1.702 m), weight 95.255 kg (210 lb), last menstrual period 07/24/2015, SpO2 98 %.  I/O:   Intake/Output Summary (Last 24 hours) at 08/24/15 1242 Last data filed at 08/24/15 1000  Gross per 24 hour  Intake    420 ml  Output    450 ml  Net    -30 ml    PHYSICAL EXAMINATION:  GENERAL:  54 y.o.-year-old patient lying in the bed with no acute distress.  EYES: Pupils equal, round, reactive to light and accommodation. No scleral icterus. Extraocular muscles intact.  HEENT: Head atraumatic, normocephalic. Oropharynx and nasopharynx clear.  NECK:  Supple, no jugular venous distention. No thyroid enlargement, no tenderness.  LUNGS: Normal breath sounds bilaterally, no wheezing, rales,rhonchi or crepitation. No use of accessory muscles of respiration.  CARDIOVASCULAR: S1, S2 normal. No murmurs, rubs, or gallops.   ABDOMEN: Soft, non-tender, non-distended. Bowel sounds present. No organomegaly or mass.  EXTREMITIES: No pedal edema, cyanosis, or clubbing.  NEUROLOGIC: Cranial nerves II through XII are intact. Muscle strength 5/5 in all extremities. Sensation intact. Gait not checked.  PSYCHIATRIC: The patient is alert and oriented x 3.  SKIN: No obvious rash, lesion, or ulcer.   DATA REVIEW:   CBC  Recent Labs Lab 08/24/15 0601  WBC 8.9  HGB 11.7*  HCT 34.5*  PLT 342    Chemistries   Recent Labs Lab 08/23/15 2315 08/24/15 0601  NA 136  --   K 3.6  --   CL 106  --   CO2 24  --   GLUCOSE 111*  --   BUN 12  --   CREATININE 0.79 0.79  CALCIUM 9.2  --   AST 27  --   ALT 28  --   ALKPHOS 59  --   BILITOT 0.5  --     Cardiac Enzymes No results for input(s): TROPONINI in the last 168 hours.  Microbiology Results  No results found for this or any previous visit.  RADIOLOGY:  No results found.  Management plans discussed with the patient, family and they are in agreement.  CODE STATUS:     Code Status Orders        Start     Ordered   08/24/15 0040  Full code   Continuous     08/24/15 0040      TOTAL TIME TAKING CARE OF THIS PATIENT: 35 minutes.    Altamese DillingVACHHANI, Khaniya Tenaglia M.D on 08/24/2015 at 12:42 PM  Between 7am to 6pm - Pager - [phone]  After 6pm go to [email or web address] - password [email or web address]  [facility]  Office  [phone]  CC: Primary care physician; Elvera Lennoxhristopher Claude Felton, DO   Note: This dictation was prepared with Dragon dictation along with smaller phrase technology. Any transcriptional errors that result from this process are unintentional.

## 2015-08-24 NOTE — Progress Notes (Signed)
Pt. Alert and oriented. VSS. Pts. Tongue is still swollen but no more swollen than it was when she arrived to unit. Pt. C/o headache. Received morphine in ED for headache with little relief. Oxycodone offered but pt. Wants to wait til it's time for morphine again to take for headache. Independently ambulating around in room.

## 2016-05-05 ENCOUNTER — Encounter: Payer: Self-pay | Admitting: *Deleted

## 2016-05-05 ENCOUNTER — Emergency Department
Admission: EM | Admit: 2016-05-05 | Discharge: 2016-05-05 | Disposition: A | Discharge status: Home / Self Care | Payer: BC Managed Care – PPO | Attending: Emergency Medicine | Admitting: Emergency Medicine

## 2016-05-05 ENCOUNTER — Emergency Department: Payer: BC Managed Care – PPO

## 2016-05-05 DIAGNOSIS — Z79891 Long term (current) use of opiate analgesic: Secondary | ICD-10-CM | POA: Insufficient documentation

## 2016-05-05 DIAGNOSIS — I10 Essential (primary) hypertension: Secondary | ICD-10-CM | POA: Diagnosis not present

## 2016-05-05 DIAGNOSIS — Z7982 Long term (current) use of aspirin: Secondary | ICD-10-CM | POA: Diagnosis not present

## 2016-05-05 DIAGNOSIS — M5442 Lumbago with sciatica, left side: Secondary | ICD-10-CM | POA: Insufficient documentation

## 2016-05-05 DIAGNOSIS — Z79899 Other long term (current) drug therapy: Secondary | ICD-10-CM | POA: Insufficient documentation

## 2016-05-05 DIAGNOSIS — M545 Low back pain: Secondary | ICD-10-CM | POA: Diagnosis present

## 2016-05-05 MED ORDER — CYCLOBENZAPRINE HCL 5 MG PO TABS: 5.0000 mg | ORAL_TABLET | Freq: Three times a day (TID) | ORAL | Status: DC | PRN

## 2016-05-05 MED ORDER — PREDNISONE 10 MG PO TABS: ORAL_TABLET | ORAL | Status: DC

## 2016-05-05 MED ORDER — OXYCODONE-ACETAMINOPHEN 5-325 MG PO TABS
2.0000 | ORAL_TABLET | Freq: Once | ORAL | Status: AC
  Administered 2016-05-05: 2 via ORAL
  Filled 2016-05-05: qty 2

## 2016-05-05 MED ORDER — OXYCODONE-ACETAMINOPHEN 5-325 MG PO TABS: 1.0000 | ORAL_TABLET | ORAL | Status: DC | PRN

## 2016-05-05 NOTE — ED Provider Notes (Signed)
[facility] Emergency Department Provider Note   ____________________________________________  Time seen: Approximately 3:14 PM  I have reviewed the triage vital signs and the nursing notes.   HISTORY  Chief Complaint Back Pain   HPI Whitney Nichols is a 55 y.o. female is here with complaint of back pain for approximately 6 days.Patient states that pain radiates down her left leg. She is not aware of any problems with her back and denies any previous sciatica. 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 Patient has taken "muscle relaxant" and ibuprofen approximate 3 hours ago. Patient drove herself to the emergency room but is currently trying to find a family member to pick her up. Currently she rates her pain as a 10 over 10. She denies any incontinence of urinary bladder. There is no other paresthesias other than the radiation from her back down her left leg to her knee.   Past Medical History  Diagnosis Date  . Hypertension   . Esophagitis     Patient Active Problem List   Diagnosis Date Noted  . Angioedema 08/24/2015    Past Surgical History  Procedure Laterality Date  . Total hip arthroplasty      Current Outpatient Rx  Name  Route  Sig  Dispense  Refill  . aspirin EC 325 MG tablet   Oral   Take 325 mg by mouth daily.         . cyclobenzaprine (FLEXERIL) 5 MG tablet   Oral   Take 1 tablet (5 mg total) by mouth 3 (three) times daily as needed for muscle spasms.   15 tablet   0   . DULoxetine (CYMBALTA) 30 MG capsule   Oral   Take 30 mg by mouth daily.         Marland Kitchen EPINEPHrine 0.3 mg/0.3 mL IJ SOAJ injection   Intramuscular   Inject 0.3 mLs (0.3 mg total) into the muscle once. Take injection by your self once, If start to have serious allergic reaction like hives, tongue swelling or trouble breathing. Then need to come to ER. Keep the pen with you all the time.   1 Device   0   .  hydrochlorothiazide (HYDRODIURIL) 25 MG tablet   Oral   Take 25 mg by mouth daily.         Marland Kitchen HYDROcodone-acetaminophen (NORCO) 7.5-325 MG tablet   Oral   Take 1 tablet by mouth every 6 (six) hours as needed. for pain      0   . ORSYTHIA 0.1-20 MG-MCG tablet   Oral   Take 1 tablet by mouth daily.      3     Dispense as written.   Marland Kitchen oxyCODONE-acetaminophen (PERCOCET) 5-325 MG tablet   Oral   Take 1 tablet by mouth every 4 (four) hours as needed for severe pain.   20 tablet   0   . pantoprazole (PROTONIX) 40 MG tablet   Oral   Take 40 mg by mouth daily.      2   . potassium chloride (KLOR-CON 10) 10 MEQ tablet   Oral   Take 1 tablet by mouth daily.         . predniSONE (DELTASONE) 10 MG tablet      Take 6 tablets  today, on day 2 take 5 tablets, day 3 take 4 tablets, day 4 take 3 tablets, day 5 take  2 tablets and 1 tablet  the last day   21 tablet   0   . Prenatal Vit-Fe Fumarate-FA (PRENATAL PO)   Oral   Take 1 tablet by mouth daily.         . valACYclovir (VALTREX) 500 MG tablet   Oral   Take 500 mg by mouth daily.           Allergies Peanut butter flavor  Family History  Problem Relation Age of Onset  . Hypertension Other     Social History Social History  Substance Use Topics  . Smoking status: Never Smoker   . Smokeless tobacco: None  . Alcohol Use: Yes    Review of Systems Constitutional: No fever/chills Cardiovascular: Denies chest pain. Respiratory: Denies shortness of breath. Gastrointestinal: No abdominal pain.  No nausea, no vomiting.   Musculoskeletal: Positive for low back pain and left hip pain. Skin: Negative for rash. Neurological: Negative for headaches.  Positive for left leg paresthesias.  10-point ROS otherwise negative.  ____________________________________________   PHYSICAL EXAM:  VITAL SIGNS: ED Triage Vitals  Enc Vitals Group     BP 05/05/16 1422 149/99 mmHg     Pulse Rate 05/05/16 1422 116     Resp  05/05/16 1422 18     Temp 05/05/16 1422 98 F (36.7 C)     Temp Source 05/05/16 1422 Oral     SpO2 05/05/16 1422 95 %     Weight 05/05/16 1422 210 lb (95.255 kg)     Height 05/05/16 1422 5\' 7"  (1.702 m)     Head Cir --      Peak Flow --      Pain Score 05/05/16 1419 10     Pain Loc --      Pain Edu? --      Excl. in GC? --     Constitutional: Alert and oriented. Well appearing and in no acute distress. Eyes: Conjunctivae are normal. PERRL. EOMI. Head: Atraumatic. Nose: No congestion/rhinnorhea. Neck: No stridor.   Cardiovascular: Normal rate, regular rhythm. Grossly normal heart sounds.  Good peripheral circulation. Respiratory: Normal respiratory effort.  No retractions. Lungs CTAB. Gastrointestinal: Soft and nontender. No distention.  Musculoskeletal: Examination of the back there is no gross deformity however there is moderate tenderness on palpation of the left sciatic area and paravertebral muscles to the left. Range of motion is moderately restricted secondary to discomfort. Patient is able to stand without assistance. Walking is guarded. Neurologic:  Normal speech and language. No gross focal neurologic deficits are appreciated. Gait was not tested secondary to patient's pain. Skin:  Skin is warm, dry and intact. No rash noted. Psychiatric: Mood and affect are normal. Speech and behavior are normal.  ____________________________________________   LABS (all labs ordered are listed, but only abnormal results are displayed)  Labs Reviewed - No data to display   RADIOLOGY  Lumbar x-ray per radiologist shows vertebral disc height loss is seen at L4-L5 and L5-S1. Facet degeneration is seen at L4-L5 and L5-S1 area. I, Tommi Rumpshonda L Kyandra Mcclaine, personally viewed and evaluated these images (plain radiographs) as part of my medical decision making, as well as reviewing the written report by the radiologist. ____________________________________________   PROCEDURES  Procedure(s)  performed: None  Critical Care performed: No  ____________________________________________   INITIAL IMPRESSION / ASSESSMENT AND PLAN / ED COURSE  Pertinent labs & imaging results that were available during my care of the patient were reviewed by me and considered in my medical decision making (see chart for details).  Patient had adequate family members come to the emergency room to drive her car home. Patient was given 2 Percocet 5/325 prior to discharge and after discussing her back x-rays. Patient was also given a prescription for prednisone 6 day taper and Flexeril 5 mg 3 times a day for 5 days if needed for muscle spasms. Patient is to make an appointment with Dr. Joice LoftsPoggi if any continued problems with her back or sciatic problems. She is also encouraged to use ice or heat to her back as needed. ____________________________________________   FINAL CLINICAL IMPRESSION(S) / ED DIAGNOSES  Final diagnoses:  Left-sided low back pain with left-sided sciatica      NEW MEDICATIONS STARTED DURING THIS VISIT:  New Prescriptions   CYCLOBENZAPRINE (FLEXERIL) 5 MG TABLET    Take 1 tablet (5 mg total) by mouth 3 (three) times daily as needed for muscle spasms.   OXYCODONE-ACETAMINOPHEN (PERCOCET) 5-325 MG TABLET    Take 1 tablet by mouth every 4 (four) hours as needed for severe pain.   PREDNISONE (DELTASONE) 10 MG TABLET    Take 6 tablets  today, on day 2 take 5 tablets, day 3 take 4 tablets, day 4 take 3 tablets, day 5 take  2 tablets and 1 tablet the last day     Note:  This document was prepared using Dragon voice recognition software and may include unintentional dictation errors.    Tommi RumpsRhonda L Lathan Gieselman, PA-C 05/05/16 1645  Sharman CheekPhillip Stafford, MD 05/06/16 [phone]

## 2016-05-05 NOTE — ED Notes (Signed)
Pt verbalized understanding of discharge instructions. NAD at this time. 

## 2016-05-05 NOTE — Discharge Instructions (Signed)
You need to follow up with your primary care doctor or Dr. Joice LoftsPoggi if any continued problems with your back or hip. Read information about sciatica. Use ice or heat to your lower back for comfort. Take Percocet as needed for severe pain, begin prednisone taper starting today, and Flexeril as needed for muscle spasms. Be aware that you cannot take this medication while driving or operating machinery and that this medication can cause drowsiness which will increase your chance for falling.

## 2016-05-05 NOTE — ED Notes (Signed)
Pt complains of back pain since Monday morning.  Pain radiates down left leg.

## 2016-08-05 DIAGNOSIS — R51 Headache: Secondary | ICD-10-CM | POA: Diagnosis present

## 2016-08-05 DIAGNOSIS — Z5321 Procedure and treatment not carried out due to patient leaving prior to being seen by health care provider: Secondary | ICD-10-CM | POA: Insufficient documentation

## 2016-08-05 DIAGNOSIS — Z79899 Other long term (current) drug therapy: Secondary | ICD-10-CM | POA: Diagnosis not present

## 2016-08-05 DIAGNOSIS — I10 Essential (primary) hypertension: Secondary | ICD-10-CM | POA: Diagnosis not present

## 2016-08-06 ENCOUNTER — Encounter: Payer: Self-pay | Admitting: Emergency Medicine

## 2016-08-06 ENCOUNTER — Emergency Department
Admission: EM | Admit: 2016-08-06 | Discharge: 2016-08-06 | Disposition: A | Discharge status: Home / Self Care | Payer: BC Managed Care – PPO | Attending: Emergency Medicine | Admitting: Emergency Medicine

## 2016-08-06 NOTE — ED Triage Notes (Signed)
Pt to triage via w/c, appears uncomfortable; [facility]; was told week ago to stop her meds in prep for such and only to take tramadol and cymbalta; c/o generalized HA accomp by N/V; pt reports hx of same and normally takes her BCP to control them; took ds of her meds tonight PTA

## 2019-02-18 ENCOUNTER — Encounter: Payer: Self-pay | Admitting: Physician Assistant

## 2019-02-18 ENCOUNTER — Emergency Department
Admission: EM | Admit: 2019-02-18 | Discharge: 2019-02-18 | Disposition: A | Discharge status: Home / Self Care | Payer: BC Managed Care – PPO | Attending: Emergency Medicine | Admitting: Emergency Medicine

## 2019-02-18 ENCOUNTER — Other Ambulatory Visit: Payer: Self-pay

## 2019-02-18 DIAGNOSIS — R220 Localized swelling, mass and lump, head: Secondary | ICD-10-CM | POA: Diagnosis present

## 2019-02-18 DIAGNOSIS — I10 Essential (primary) hypertension: Secondary | ICD-10-CM | POA: Insufficient documentation

## 2019-02-18 DIAGNOSIS — T783XXA Angioneurotic edema, initial encounter: Secondary | ICD-10-CM | POA: Diagnosis not present

## 2019-02-18 DIAGNOSIS — Z96649 Presence of unspecified artificial hip joint: Secondary | ICD-10-CM | POA: Insufficient documentation

## 2019-02-18 DIAGNOSIS — Z91010 Allergy to peanuts: Secondary | ICD-10-CM | POA: Insufficient documentation

## 2019-02-18 DIAGNOSIS — Z79899 Other long term (current) drug therapy: Secondary | ICD-10-CM | POA: Diagnosis not present

## 2019-02-18 LAB — BASIC METABOLIC PANEL
Anion gap: 8 (ref 5–15)
BUN: 16 mg/dL (ref 6–20)
CO2: 27 mmol/L (ref 22–32)
Calcium: 9.4 mg/dL (ref 8.9–10.3)
Chloride: 102 mmol/L (ref 98–111)
Creatinine, Ser: 0.78 mg/dL (ref 0.44–1.00)
GFR calc Af Amer: >60 mL/min (ref >60)
GFR calc non Af Amer: >60 mL/min (ref >60)
Glucose, Bld: 182 mg/dL — ABNORMAL HIGH (ref 70–99)
Potassium: 3.3 mmol/L — ABNORMAL LOW (ref 3.5–5.1)
Sodium: 137 mmol/L (ref 135–145)

## 2019-02-18 LAB — CBC
HCT: 35.1 % — ABNORMAL LOW (ref 36.0–46.0)
Hemoglobin: 11.3 g/dL — ABNORMAL LOW (ref 12.0–15.0)
MCH: 31.4 pg (ref 26.0–34.0)
MCHC: 32.2 g/dL (ref 30.0–36.0)
MCV: 97.5 fL (ref 80.0–100.0)
Platelets: 341 10*3/uL (ref 150–400)
RBC: 3.6 MIL/uL — ABNORMAL LOW (ref 3.87–5.11)
RDW: 13.2 % (ref 11.5–15.5)
WBC: 10.6 10*3/uL — ABNORMAL HIGH (ref 4.0–10.5)
nRBC: 0 % (ref 0.0–0.2)

## 2019-02-18 LAB — URINALYSIS, COMPLETE (UACMP) WITH MICROSCOPIC
Bacteria, UA: NONE SEEN
Bilirubin Urine: NEGATIVE
Glucose, UA: NEGATIVE mg/dL
Hgb urine dipstick: NEGATIVE
Ketones, ur: NEGATIVE mg/dL
Leukocytes,Ua: NEGATIVE
Nitrite: NEGATIVE
Protein, ur: NEGATIVE mg/dL
Specific Gravity, Urine: 1.009 (ref 1.005–1.030)
pH: 6 (ref 5.0–8.0)

## 2019-02-18 MED ORDER — SODIUM CHLORIDE 0.9 % IV BOLUS
1000.0000 mL | Freq: Once | INTRAVENOUS | Status: AC
  Administered 2019-02-18: 1000 mL via INTRAVENOUS

## 2019-02-18 MED ORDER — DIPHENHYDRAMINE HCL 50 MG/ML IJ SOLN
25.0000 mg | Freq: Once | INTRAMUSCULAR | Status: AC
  Administered 2019-02-18: 25 mg via INTRAVENOUS
  Filled 2019-02-18: qty 1

## 2019-02-18 MED ORDER — FAMOTIDINE IN NACL 20-0.9 MG/50ML-% IV SOLN
20.0000 mg | Freq: Once | INTRAVENOUS | Status: AC
  Administered 2019-02-18: 20 mg via INTRAVENOUS
  Filled 2019-02-18: qty 50

## 2019-02-18 MED ORDER — PREDNISONE 20 MG PO TABS: 20.0000 mg | ORAL_TABLET | Freq: Two times a day (BID) | ORAL | 0 refills | Status: AC

## 2019-02-18 MED ORDER — DEXAMETHASONE SODIUM PHOSPHATE 10 MG/ML IJ SOLN
10.0000 mg | Freq: Once | INTRAMUSCULAR | Status: AC
  Administered 2019-02-18: 10 mg via INTRAVENOUS
  Filled 2019-02-18: qty 1

## 2019-02-18 MED ORDER — ACETAMINOPHEN 500 MG PO TABS
1000.0000 mg | ORAL_TABLET | Freq: Once | ORAL | Status: AC
  Administered 2019-02-18: 1000 mg via ORAL
  Filled 2019-02-18: qty 2

## 2019-02-18 MED ORDER — EPINEPHRINE 0.3 MG/0.3ML IJ SOAJ
0.3000 mg | Freq: Once | INTRAMUSCULAR | Status: AC
  Administered 2019-02-18: 0.3 mg via INTRAMUSCULAR
  Filled 2019-02-18: qty 0.3

## 2019-02-18 MED ORDER — EPINEPHRINE 0.3 MG/0.3ML IJ SOAJ: 0.3000 mg | Freq: Once | INTRAMUSCULAR | 0 refills | Status: AC

## 2019-02-18 MED ORDER — FAMOTIDINE 20 MG PO TABS: 20.0000 mg | ORAL_TABLET | Freq: Two times a day (BID) | ORAL | 0 refills | Status: AC

## 2019-02-18 NOTE — ED Triage Notes (Signed)
PT arrives via pov after injecting herself with personal epi pen from possible allergic reaction to motrin. Pt reports previously been told "to stay clear from motrin" but reports lower right back pain that caused her to take medication. Pt reports taking motrin at 1630 this am and waking up with a swollen tongue. Pt pale in triage. PT took epi pen 30 minutes prior to triage.

## 2019-02-18 NOTE — ED Provider Notes (Signed)
Ongoing care assigned to Dr. Alphonzo Lemmings.  Patient being observed after receiving epinephrine for suspected allergic reaction to NSAIDs.  She had sublingual edema that is regressing and she is improving.  Wish to observe her until approximately 6 PM, if improved plan will be to discharge with prescription for EpiPen's and steroids.  If worsening or persistent sub-lingular edema, would admit for further treatment and observation   Sharyn Creamer, MD 02/18/19 1521

## 2019-02-18 NOTE — Discharge Instructions (Signed)
You have been treated for an allergic reaction and angioedema. Continue to take OTC Benadryl daily along with the steroid. Follow-up with your provider for ongoing management. Avoid

## 2019-02-18 NOTE — ED Provider Notes (Addendum)
[facility] Emergency Department Provider Note ____________________________________________  Time seen: 1236  I have reviewed the triage vital signs and the nursing notes.  HISTORY  Chief Complaint  Allergic Reaction   HPI Whitney Nichols is a 58 y.o. female presents herself to the ED from home, for evaluation of swelling to the tongue.  Patient with a history of angioedema, hypertension, GERD, and irritable laryngitis, presents after supplementation of her EpiPen.  Patient describes she took a single anti-milligrams dose of ibuprofen about 4 AM this morning, for some right-sided low back pain.  She describes at about 10:00 this morning, she awoke with a sensation of swelling to her tongue.  She administered her EpiPen about 45 minutes prior to this evaluation.  She describes some improvement of her symptoms at this time, but continues to note some swelling to the sublingual region.  She denies any chest pain, shortness of breath, or syncope.  She describes right-sided low back/flank pain without unknown cause but denies any preceding injury, accident, or trauma.  She denies any abdominal pain, hematuria, dysuria, urinary frequency.  She also denies any distal paresthesias, foot drop, or saddle anesthesias.  Past Medical History:  Diagnosis Date  . Esophagitis   . Hypertension     Patient Active Problem List   Diagnosis Date Noted  . Angioedema 08/24/2015    Past Surgical History:  Procedure Laterality Date  . TOTAL HIP ARTHROPLASTY      Prior to Admission medications   Medication Sig Start Date End Date Taking? Authorizing Provider  cyclobenzaprine (FLEXERIL) 5 MG tablet Take 1 tablet (5 mg total) by mouth 3 (three) times daily as needed for muscle spasms. 05/05/16   Tommi Rumps, PA-C  DULoxetine (CYMBALTA) 30 MG capsule Take 30 mg by mouth daily. 08/21/15 08/20/16  [provider]  EPINEPHrine 0.3 mg/0.3 mL IJ SOAJ injection Inject 0.3 mLs  (0.3 mg total) into the muscle once. Take injection by your self once, If start to have serious allergic reaction like hives, tongue swelling or trouble breathing. Then need to come to ER. Keep the pen with you all the time. 08/24/15   Altamese Dilling, MD  EPINEPHrine 0.3 mg/0.3 mL IJ SOAJ injection Inject 0.3 mLs (0.3 mg total) into the muscle once for 1 dose. 02/18/19 02/18/19  Sharyn Creamer, MD  hydrochlorothiazide (HYDRODIURIL) 25 MG tablet Take 25 mg by mouth daily.    [provider]  HYDROcodone-acetaminophen (NORCO) 7.5-325 MG tablet Take 1 tablet by mouth every 6 (six) hours as needed. for pain 07/31/15   [provider]  ORSYTHIA 0.1-20 MG-MCG tablet Take 1 tablet by mouth daily. 07/30/15   [provider]  oxyCODONE-acetaminophen (PERCOCET) 5-325 MG tablet Take 1 tablet by mouth every 4 (four) hours as needed for severe pain. 05/05/16   Tommi Rumps, PA-C  pantoprazole (PROTONIX) 40 MG tablet Take 40 mg by mouth daily. 06/06/15   [provider]  potassium chloride (KLOR-CON 10) 10 MEQ tablet Take 1 tablet by mouth daily. 06/08/15   [provider]  predniSONE (DELTASONE) 10 MG tablet Take 6 tablets  today, on day 2 take 5 tablets, day 3 take 4 tablets, day 4 take 3 tablets, day 5 take  2 tablets and 1 tablet the last day 05/05/16   Tommi Rumps, PA-C  Prenatal Vit-Fe Fumarate-FA (PRENATAL PO) Take 1 tablet by mouth daily.    [provider]  valACYclovir (VALTREX) 500 MG tablet Take 500 mg by mouth  daily. 11/14/12   [provider]    Allergies Motrin [ibuprofen] and Peanut butter flavor  Family History  Problem Relation Age of Onset  . Hypertension Other     Social History Social History   Tobacco Use  . Smoking status: Never Smoker  . Smokeless tobacco: Never Used  Substance Use Topics  . Alcohol use: Yes  . Drug use: No    Review of Systems  Constitutional: Negative for fever. Eyes: Negative for  visual changes. ENT: Negative for sore throat.  Positive for tongue swelling. Cardiovascular: Negative for chest pain. Respiratory: Negative for shortness of breath. Gastrointestinal: Negative for abdominal pain, vomiting and diarrhea. Genitourinary: Negative for dysuria. Musculoskeletal: Positive for back pain. Skin: Negative for rash. Neurological: Negative for headaches, focal weakness or numbness. ____________________________________________  PHYSICAL EXAM:  VITAL SIGNS: ED Triage Vitals  Enc Vitals Group     BP 02/18/19 1238 (!) 150/87     Pulse Rate 02/18/19 1238 (!) 127     Resp 02/18/19 1238 17     Temp 02/18/19 1238 99 F (37.2 C)     Temp Source 02/18/19 1238 Oral     SpO2 02/18/19 1238 95 %     Weight 02/18/19 1235 213 lb (96.6 kg)     Height 02/18/19 1235 5\' 7"  (1.702 m)     Head Circumference --      Peak Flow --      Pain Score 02/18/19 1235 7     Pain Loc --      Pain Edu? --      Excl. in GC? --     Constitutional: Alert and oriented. Well appearing and in no distress. Head: Normocephalic and atraumatic. Eyes: Conjunctivae are normal. PERRL. Normal extraocular movements Nose: No congestion/rhinorrhea/epistaxis. Mouth/Throat: Mucous membranes are moist.  Uvula is midline and tonsils are flat.  Patient with some edema noted to the right-sided sublingual region of the tongue.  The tongue looks unilaterally edematous on the right.  Patient with controls or secretions at this time. Neck: Supple. No thyromegaly. Hematological/Lymphatic/Immunological: No cervical lymphadenopathy. Cardiovascular: Tachy rate, regular rhythm. Normal distal pulses. Respiratory: Normal respiratory effort. No wheezes/rales/rhonchi. Gastrointestinal: Soft and nontender. No distention. No CVA Musculoskeletal: Normal spinal alignment without midline tenderness, spasm, vomiting, or step-off.  Patient tender to palpation to the right lumbar sacral region.  Nontender with normal range of  motion in all extremities.  Neurologic:  Normal gait without ataxia. Normal speech and language.  Negative supine straight leg raise, but maneuver on the right does elicit right-sided low back pain.  No gross focal neurologic deficits are appreciated. Skin:  Skin is warm, dry and intact. No rash noted. Psychiatric: Mood and affect are normal. Patient exhibits appropriate insight and judgment. ____________________________________________   LABS (pertinent positives/negatives) Labs Reviewed  URINALYSIS, COMPLETE (UACMP) WITH MICROSCOPIC - Abnormal; Notable for the following components:      Result Value   Color, Urine YELLOW (*)    APPearance CLEAR (*)    All other components within normal limits  CBC - Abnormal; Notable for the following components:   WBC 10.6 (*)    RBC 3.60 (*)    Hemoglobin 11.3 (*)    HCT 35.1 (*)    All other components within normal limits  BASIC METABOLIC PANEL - Abnormal; Notable for the following components:   Potassium 3.3 (*)    Glucose, Bld 182 (*)    All other components within normal limits  ____________________________________________  PROCEDURES  Procedures  Epinephrine 0.3 ml IM x 1 Decadron 10 mg IVP Benadryl 25 mg IVP Famotidine 20 mg IVPB Acetaminophen 1 g PO ____________________________________________  INITIAL IMPRESSION / ASSESSMENT AND PLAN / ED COURSE  Patient with ED evaluation of angioedema after an oral dose of ibuprofen.  The patient's clinical picture is improving during her tenure here in the ED.  She had a second dose of epinephrine here, and continues to report improvement of her symptoms.  Labs are reassuring at this time.  Patient will be placed in observation status for the next 4 hours, and discharge is expected at that time.  Her epinephrine pen has been refilled at this time.  She is also discharged with prescriptions for prednisone and famotidine.  She is encouraged to take TC Benadryl for additional histamine blockade.  She  was given the option of admission for further observation given the possibility of rebound angioedema.  Patient reports subjective symptom resolution at this time.  She is requesting discharge.  She verbalizes understanding of her discharge instructions and return precautions.  She will follow-up with her allergist for further management. ____________________________________________  FINAL CLINICAL IMPRESSION(S) / ED DIAGNOSES  Final diagnoses:  Angioedema, initial encounter      Lissa HoardMenshew, Jakari Jacot V Bacon, PA-C 02/18/19 1533    Sharyn CreamerQuale, Mark, MD 02/18/19 1541    Karmen StabsMenshew, Charlesetta IvoryJenise V Bacon, PA-C 02/18/19 1807    Sharyn CreamerQuale, Mark, MD 02/19/19 2153

## 2019-02-18 NOTE — ED Provider Notes (Signed)
Vitals:   02/18/19 1238  BP: (!) 150/87  Pulse: (!) 127  Resp: 17  Temp: 99 F (37.2 C)  SpO2: 95%     Personally saw the patient at this time.  She does have sublingual edema, her airway is not obstructed but she definitely has evidence of edema at the base of the tongue.  She reports that her tongue was far more swollen earlier and responded well to epinephrine.  She is still somewhat tachycardic heart rate about 115 at this time, I will give a second dose of epinephrine to see if we can further improve the angioedema located the base of her tongue as this is in her airway and I am concerned that if her symptoms were to worsen that she may need management of her airway.  She does however report that her symptoms are better than they were earlier, but she still clearly does have swelling in the oropharynx and angioedema.     Sharyn Creamer, MD 02/18/19 1258

## 2019-02-18 NOTE — ED Provider Notes (Signed)
Medical screening examination/treatment/procedure(s) were conducted as a shared visit with non-physician practitioner(s) and myself.  I personally evaluated the patient during the encounter.     Sharyn Creamer, MD 02/18/19 1323

## 2019-02-18 NOTE — ED Notes (Signed)
Pt states took motrin at 430am. Awoke feeling like her tongue was swollen around 10am, so took her epi pen approx 30 mins ago. Was told to stay away from motrin d/t to left jaw swelling.

## 2019-02-25 ENCOUNTER — Emergency Department
Admission: EM | Admit: 2019-02-25 | Discharge: 2019-02-25 | Disposition: A | Discharge status: Home / Self Care | Payer: BC Managed Care – PPO | Attending: Emergency Medicine | Admitting: Emergency Medicine

## 2019-02-25 ENCOUNTER — Other Ambulatory Visit: Payer: Self-pay

## 2019-02-25 ENCOUNTER — Encounter: Payer: Self-pay | Admitting: Emergency Medicine

## 2019-02-25 DIAGNOSIS — M545 Low back pain, unspecified: Secondary | ICD-10-CM

## 2019-02-25 DIAGNOSIS — I10 Essential (primary) hypertension: Secondary | ICD-10-CM | POA: Diagnosis not present

## 2019-02-25 DIAGNOSIS — Z79899 Other long term (current) drug therapy: Secondary | ICD-10-CM | POA: Diagnosis not present

## 2019-02-25 DIAGNOSIS — Z96649 Presence of unspecified artificial hip joint: Secondary | ICD-10-CM | POA: Insufficient documentation

## 2019-02-25 LAB — URINALYSIS, COMPLETE (UACMP) WITH MICROSCOPIC
Bacteria, UA: NONE SEEN
Bilirubin Urine: NEGATIVE
Glucose, UA: NEGATIVE mg/dL
Hgb urine dipstick: NEGATIVE
Ketones, ur: NEGATIVE mg/dL
Nitrite: NEGATIVE
Protein, ur: 100 mg/dL — AB
Specific Gravity, Urine: 1.021 (ref 1.005–1.030)
pH: 7 (ref 5.0–8.0)

## 2019-02-25 LAB — GLUCOSE, CAPILLARY: Glucose-Capillary: 131 mg/dL — ABNORMAL HIGH (ref 70–99)

## 2019-02-25 MED ORDER — ONDANSETRON 4 MG PO TBDP
4.0000 mg | ORAL_TABLET | Freq: Once | ORAL | Status: AC
  Administered 2019-02-25: 4 mg via ORAL
  Filled 2019-02-25: qty 1

## 2019-02-25 MED ORDER — OXYCODONE HCL 5 MG PO TABS
5.0000 mg | ORAL_TABLET | Freq: Once | ORAL | Status: AC
  Administered 2019-02-25: 5 mg via ORAL
  Filled 2019-02-25: qty 1

## 2019-02-25 MED ORDER — TIZANIDINE HCL 4 MG PO TABS: 4.0000 mg | ORAL_TABLET | Freq: Three times a day (TID) | ORAL | 0 refills | Status: AC

## 2019-02-25 MED ORDER — HYDROCODONE-ACETAMINOPHEN 5-325 MG PO TABS: 1.0000 | ORAL_TABLET | ORAL | 0 refills | Status: AC | PRN

## 2019-02-25 MED ORDER — HYDROMORPHONE HCL 1 MG/ML IJ SOLN
1.0000 mg | Freq: Once | INTRAMUSCULAR | Status: AC
  Administered 2019-02-25: 14:00:00 1 mg via INTRAMUSCULAR
  Filled 2019-02-25: qty 1

## 2019-02-25 MED ORDER — ORPHENADRINE CITRATE 30 MG/ML IJ SOLN
60.0000 mg | Freq: Two times a day (BID) | INTRAMUSCULAR | Status: DC
  Administered 2019-02-25: 13:00:00 60 mg via INTRAMUSCULAR
  Filled 2019-02-25: qty 2

## 2019-02-25 NOTE — ED Notes (Signed)
Pt alert and oriented X4, active, cooperative, pt in NAD. RR even and unlabored, color WNL.  Pt informed to return if any life threatening symptoms occur.  Discharge and followup instructions reviewed. Ambulates safely. 

## 2019-02-25 NOTE — ED Notes (Signed)
See triage note  Presents with lower back pain  States she moved a dresser last week  States pain started the next day  States pain is mainly on left side now  Denies any urinary sxs'

## 2019-02-25 NOTE — ED Notes (Signed)
Assisted pt to the restroom for a urine sample.

## 2019-02-25 NOTE — ED Provider Notes (Signed)
[facility] Emergency Department Provider Note ____________________________________________  Time seen: Approximately 1:04 PM  I have reviewed the triage vital signs and the nursing notes.   HISTORY  Chief Complaint Back Pain    HPI Whitney Nichols is a 58 y.o. female who presents to the emergency department for evaluation and treatment of low back pain.  She states that last week she moved a dresser away from the wall just the plug according to the outlet behind it.  She states that her back started hurting "all the way across" later that day.  She has been resting, using heating pad, and taking muscle relaxers which seemed to help a little bit until this morning.  She states that the pain woke her from her sleep and is now only on the left side.  Pain does not radiate into the leg.  She has had no recent falls or injuries other than the pain felt after moving a dresser away from the wall.  She has a recent history of angioedema and a remote total hip arthroplasty.  Past Medical History:  Diagnosis Date  . Esophagitis   . Hypertension     Patient Active Problem List   Diagnosis Date Noted  . Angioedema 08/24/2015    Past Surgical History:  Procedure Laterality Date  . TOTAL HIP ARTHROPLASTY      Prior to Admission medications   Medication Sig Start Date End Date Taking? Authorizing Provider  cyclobenzaprine (FLEXERIL) 10 MG tablet Take 10 mg by mouth 3 (three) times daily as needed for muscle spasms.   Yes [provider]  gabapentin (NEURONTIN) 300 MG capsule Take 300 mg by mouth 3 (three) times daily. 07/19/16  Yes [provider]  DULoxetine (CYMBALTA) 30 MG capsule Take 30 mg by mouth daily. 08/21/15 08/20/16  [provider]  EPINEPHrine 0.3 mg/0.3 mL IJ SOAJ injection Inject 0.3 mLs (0.3 mg total) into the muscle once. Take injection by your self once, If start to have serious allergic reaction like hives, tongue swelling  or trouble breathing. Then need to come to ER. Keep the pen with you all the time. 08/24/15   Altamese Dilling, MD  famotidine (PEPCID) 20 MG tablet Take 1 tablet (20 mg total) by mouth 2 (two) times daily for 10 days. 02/18/19 02/28/19  Menshew, Charlesetta Ivory, PA-C  hydrochlorothiazide (HYDRODIURIL) 25 MG tablet Take 25 mg by mouth daily.    [provider]  HYDROcodone-acetaminophen (NORCO/VICODIN) 5-325 MG tablet Take 1 tablet by mouth every 4 (four) hours as needed for moderate pain. 02/25/19 02/25/20  Rakesha Dalporto B, FNP  ORSYTHIA 0.1-20 MG-MCG tablet Take 1 tablet by mouth daily. 07/30/15   [provider]  pantoprazole (PROTONIX) 40 MG tablet Take 40 mg by mouth daily. 06/06/15   [provider]  potassium chloride (KLOR-CON 10) 10 MEQ tablet Take 1 tablet by mouth daily. 06/08/15   [provider]  tiZANidine (ZANAFLEX) 4 MG tablet Take 1 tablet (4 mg total) by mouth 3 (three) times daily for 14 days. 02/25/19 03/11/19  Page Pucciarelli, Rulon Eisenmenger B, FNP  valACYclovir (VALTREX) 500 MG tablet Take 500 mg by mouth daily. 11/14/12   [provider]    Allergies Motrin [ibuprofen] and Peanut butter flavor  Family History  Problem Relation Age of Onset  . Hypertension Other     Social History Social History   Tobacco Use  . Smoking status: Never Smoker  . Smokeless tobacco: Never Used  Substance Use Topics  .  Alcohol use: Yes  . Drug use: No    Review of Systems Constitutional: Negative for fever. Cardiovascular: Negative for chest pain. Respiratory: Negative for shortness of breath. Musculoskeletal: Positive for left lower back pain. Skin: Negative for open wounds or lesions. Neurological: Negative for decrease in sensation.  Negative for radiculopathy.  ____________________________________________   PHYSICAL EXAM:  VITAL SIGNS: ED Triage Vitals  Enc Vitals Group     BP 02/25/19 1143 105/75     Pulse Rate 02/25/19 1142 91     Resp  02/25/19 1142 16     Temp 02/25/19 1142 97.7 F (36.5 C)     Temp Source 02/25/19 1142 Oral     SpO2 02/25/19 1142 100 %     Weight 02/25/19 1212 212 lb 8.4 oz (96.4 kg)     Height 02/25/19 1212 5\' 7"  (1.702 m)     Head Circumference --      Peak Flow --      Pain Score 02/25/19 1142 10     Pain Loc --      Pain Edu? --      Excl. in GC? --     Constitutional: Alert and oriented. Well appearing and in no acute distress. Eyes: Conjunctivae are clear without discharge or drainage Head: Atraumatic Neck: Supple. Respiratory: No cough. Respirations are even and unlabored. Musculoskeletal: Palpable muscle spasm in the left lumbar. Neurologic: Strength in the extremities is equal Skin: No wounds or lesions noted. Psychiatric: Affect and behavior are appropriate.  ____________________________________________   LABS (all labs ordered are listed, but only abnormal results are displayed)  Labs Reviewed  URINALYSIS, COMPLETE (UACMP) WITH MICROSCOPIC - Abnormal; Notable for the following components:      Result Value   Color, Urine YELLOW (*)    APPearance HAZY (*)    Protein, ur 100 (*)    Leukocytes,Ua TRACE (*)    All other components within normal limits  GLUCOSE, CAPILLARY - Abnormal; Notable for the following components:   Glucose-Capillary 131 (*)    All other components within normal limits  CBG MONITORING, ED   ____________________________________________  RADIOLOGY  Not indicated. ____________________________________________   PROCEDURES  Procedures  ____________________________________________   INITIAL IMPRESSION / ASSESSMENT AND PLAN / ED COURSE  Whitney Nichols is a 58 y.o. who presents to the emergency department for treatment and evaluation of left lower back pain.  No specific injury.  She appears to be in a significant amount of pain.  Injection of Toradol and tablet of Roxicet IR ordered.  Will  monitor.  ----------------------------------------- 1:53 PM on 02/25/2019 -----------------------------------------  Very little improvement in pain.  Patient still unable to sit up or roll without intense, sharp pain.  She states that the pain feels like it has "moved a little further up."  Dilaudid IM ordered.  Will plan on obtaining urinalysis to evaluate for hematuria when she is able to move a little easier. Will consider CT for renal stone if positive.   ----------------------------------------- 3:59 PM on 02/25/2019 -----------------------------------------  After dilaudid, patient became diaphoretic, nauseated, and shaky. Glucose and vital signs are stable.   Urine shows no indication of hematuria although she does have some hyaline casts. Trace leukocytes are present, but no bacteria and she is asymptomatic for acute cystitis. No further testing to be done today. She has been up walking without assistance while here. She is to follow up with her primary care provider if not improving over the next 3 days. She is to  return to the ER for symptoms of concern if unable to schedule an appointment.   Medications  orphenadrine (NORFLEX) injection 60 mg (60 mg Intramuscular Given 02/25/19 1235)  oxyCODONE (Oxy IR/ROXICODONE) immediate release tablet 5 mg (5 mg Oral Given 02/25/19 1235)  HYDROmorphone (DILAUDID) injection 1 mg (1 mg Intramuscular Given 02/25/19 1352)  ondansetron (ZOFRAN-ODT) disintegrating tablet 4 mg (4 mg Oral Given 02/25/19 1506)    Pertinent labs & imaging results that were available during my care of the patient were reviewed by me and considered in my medical decision making (see chart for details).  _________________________________________   FINAL CLINICAL IMPRESSION(S) / ED DIAGNOSES  Final diagnoses:  Acute left-sided low back pain without sciatica    ED Discharge Orders         Ordered    tiZANidine (ZANAFLEX) 4 MG tablet  3 times daily     02/25/19  1558    HYDROcodone-acetaminophen (NORCO/VICODIN) 5-325 MG tablet  Every 4 hours PRN     02/25/19 1558           If controlled substance prescribed during this visit, 12 month history viewed on the [facility] prior to issuing an initial prescription for Schedule II or III opiod.   Chinita Pesterriplett, Malayasia Mirkin B, FNP 02/25/19 1604    Jeanmarie PlantMcShane, James A, MD 02/26/19 [phone]

## 2019-02-25 NOTE — Discharge Instructions (Signed)
Please follow up with your primary care provider if not improving over the next few days. ° °Return to the ER for symptoms that change or worsen if unable to schedule an appointment. °

## 2019-02-25 NOTE — ED Triage Notes (Signed)
Pt c/o lower back pain , states was moving furniture last week. PT ambulatory with assistance. NAD noted

## 2019-02-25 NOTE — ED Notes (Signed)
Got pt up  to go to bathroom via w/c  States she is dizzy and feels faint  Taken back to room  Slightly diaphoretic requesting something to drink  Po fluids given

## 2019-08-02 ENCOUNTER — Other Ambulatory Visit: Payer: Self-pay

## 2019-08-02 DIAGNOSIS — Z20822 Contact with and (suspected) exposure to covid-19: Secondary | ICD-10-CM

## 2019-08-03 LAB — NOVEL CORONAVIRUS, NAA: SARS-CoV-2, NAA: NOT DETECTED

## 2019-10-06 ENCOUNTER — Other Ambulatory Visit: Payer: Self-pay

## 2019-10-06 DIAGNOSIS — Z20822 Contact with and (suspected) exposure to covid-19: Secondary | ICD-10-CM

## 2019-10-08 LAB — NOVEL CORONAVIRUS, NAA: SARS-CoV-2, NAA: NOT DETECTED

## 2019-10-09 ENCOUNTER — Telehealth: Payer: Self-pay | Admitting: Family Medicine

## 2019-10-09 NOTE — Telephone Encounter (Signed)
Pt calling to get COVID results.  Made her aware those are negative. °

## 2020-01-20 ENCOUNTER — Ambulatory Visit: Payer: Self-pay

## 2020-01-20 NOTE — Telephone Encounter (Signed)
Incoming call from Patient stating that  That she is scheduling 1st covid  Vaccination.  Reports that that she has a cough although she has had cough for a number of years.    Denies fever denies body aches and any other Sx.Encouraged Patient as long as there is no temperature.

## 2020-01-21 ENCOUNTER — Ambulatory Visit: Payer: BC Managed Care – PPO | Attending: Internal Medicine

## 2020-01-21 DIAGNOSIS — Z23 Encounter for immunization: Secondary | ICD-10-CM

## 2020-01-21 NOTE — Progress Notes (Signed)
   Covid-19 Vaccination Clinic  Name:  Zinia Innocent    MRN: [id number] DOB: 1961-10-18  01/21/2020  Ms. Holdsworth was observed post Covid-19 immunization for 15 minutes without incident. She was provided with Vaccine Information Sheet and instruction to access the V-Safe system.   Ms. Butrum was instructed to call 911 with any severe reactions post vaccine: Marland Kitchen Difficulty breathing  . Swelling of face and throat  . A fast heartbeat  . A bad rash all over body  . Dizziness and weakness   Immunizations Administered    Name Date Dose VIS Date Route   Pfizer COVID-19 Vaccine 01/21/2020  8:32 AM 0.3 mL 10/15/2019 Intramuscular   Manufacturer: [employer], [employer]   Lot: [id number]   NDC: [address]-1

## 2020-02-15 ENCOUNTER — Ambulatory Visit: Payer: BC Managed Care – PPO | Attending: Internal Medicine

## 2020-02-15 DIAGNOSIS — Z23 Encounter for immunization: Secondary | ICD-10-CM

## 2020-02-15 NOTE — Progress Notes (Signed)
   Covid-19 Vaccination Clinic  Name:  Whitney Nichols    MRN: [id number] DOB: 12/26/1960  02/15/2020  Whitney Nichols was observed post Covid-19 immunization for 15 minutes without incident. She was provided with Vaccine Information Sheet and instruction to access the V-Safe system.   Whitney Nichols was instructed to call 911 with any severe reactions post vaccine: Marland Kitchen Difficulty breathing  . Swelling of face and throat  . A fast heartbeat  . A bad rash all over body  . Dizziness and weakness   Immunizations Administered    Name Date Dose VIS Date Route   Pfizer COVID-19 Vaccine 02/15/2020  9:42 AM 0.3 mL 10/15/2019 Intramuscular   Manufacturer: [employer], [employer]   Lot: [id number]   NDC: [address]-8

## 2020-11-21 ENCOUNTER — Other Ambulatory Visit: Payer: Self-pay

## 2020-11-21 ENCOUNTER — Other Ambulatory Visit: Payer: Self-pay | Admitting: Orthopaedic Surgery

## 2020-11-21 ENCOUNTER — Ambulatory Visit
Admission: RE | Admit: 2020-11-21 | Discharge: 2020-11-21 | Disposition: A | Discharge status: Home / Self Care | Payer: BC Managed Care – PPO | Source: Ambulatory Visit | Attending: Orthopaedic Surgery | Admitting: Orthopaedic Surgery

## 2020-11-21 DIAGNOSIS — M79605 Pain in left leg: Secondary | ICD-10-CM

## 2022-04-09 NOTE — Progress Notes (Unsigned)
Cardiology Office Note  Date:  04/10/2022   ID:  Whitney Nichols, DOB 1961/02/02, MRN [id number]  PCP:  Arne Cleveland, MD   Chief Complaint  Patient presents with   New Patient (Initial Visit)    Ref by Dr. Clearance Coots for palpitations and discuss Echo from Feb. 2023. Medications reviewed by the patient verbally.     HPI:  Whitney Nichols is a 61 year old woman with past medical history of Essential hypertension Family history cardiac disease Who presents by referral from Dr. Gwen Pounds for consultation of her palpitations, abnormal EKG  Remote history reviewed Chest pain, seen in the hospital 06/2015: characteristics of musculoskeletal pain  CT chest 06/2015, images pulled up and reviewed No significant CAD, no aortic atherosclerosis  On discussion she is active, no chest pain on exertion Reports some atypical left-sided chest discomfort at rest, feels like it could be muscle Has some paravertebral muscle tightness left posterior shoulder area Denies chest pain or shortness of breath on exertion  Rare palpitation weekly, lasting several seconds at a time  EKG personally reviewed by myself on todays visit Normal sinus rhythm rate 73 bpm no significant ST-T wave changes  Family hx mother who died at 58 suddenly from a massive heart attack.   PMH:   has a past medical history of Esophagitis and Hypertension.  PSH:    Past Surgical History:  Procedure Laterality Date   TOTAL HIP ARTHROPLASTY      Current Outpatient Medications  Medication Sig Dispense Refill   aspirin EC 325 MG tablet Take 325 mg by mouth daily.     cyclobenzaprine (FLEXERIL) 10 MG tablet Take 10 mg by mouth 3 (three) times daily as needed for muscle spasms.     DULoxetine (CYMBALTA) 30 MG capsule Take 30 mg by mouth daily.     hydrochlorothiazide (HYDRODIURIL) 25 MG tablet Take 25 mg by mouth daily.     ibuprofen (ADVIL) 600 MG tablet Take 600 mg by mouth every 6 (six) hours as needed.      omeprazole (PRILOSEC) 20 MG capsule Take 20 mg by mouth daily.     potassium chloride SA (KLOR-CON M) 20 MEQ tablet Take 1 tablet by mouth daily.     Semaglutide,0.25 or 0.5MG /DOS, 2 MG/3ML SOPN Inject into the skin once a week.     valACYclovir (VALTREX) 500 MG tablet Take 500 mg by mouth daily.     EPINEPHrine 0.3 mg/0.3 mL IJ SOAJ injection Inject 0.3 mLs (0.3 mg total) into the muscle once. Take injection by your self once, If start to have serious allergic reaction like hives, tongue swelling or trouble breathing. Then need to come to ER. Keep the pen with you all the time. (Patient not taking: Reported on 04/10/2022) 1 Device 0   famotidine (PEPCID) 20 MG tablet Take 1 tablet (20 mg total) by mouth 2 (two) times daily for 10 days. (Patient not taking: Reported on 04/10/2022) 20 tablet 0   gabapentin (NEURONTIN) 300 MG capsule Take 300 mg by mouth 3 (three) times daily. (Patient not taking: Reported on 04/10/2022)     ORSYTHIA 0.1-20 MG-MCG tablet Take 1 tablet by mouth daily. (Patient not taking: Reported on 04/10/2022)  3   pantoprazole (PROTONIX) 40 MG tablet Take 40 mg by mouth daily. (Patient not taking: Reported on 04/10/2022)  2   No current facility-administered medications for this visit.    Allergies:   Peanut (diagnostic), Peanut butter flavor, Peanut-containing drug products, Motrin [ibuprofen], and Other  Social History:  The patient  reports that she has never smoked. She has never used smokeless tobacco. She reports current alcohol use. She reports that she does not use drugs.   Family History:   family history includes Cardiomyopathy in her brother; Heart attack in her mother; Heart disease in her mother; Hyperlipidemia in her mother; Hypertension in her mother and another family member.    Review of Systems: Review of Systems  Constitutional: Negative.   HENT: Negative.    Respiratory: Negative.    Cardiovascular:  Positive for palpitations.  Gastrointestinal: Negative.    Musculoskeletal: Negative.   Neurological: Negative.   Psychiatric/Behavioral: Negative.    All other systems reviewed and are negative.   PHYSICAL EXAM: VS:  BP 110/80 (BP Location: Right Arm, Patient Position: Sitting, Cuff Size: Normal)   Pulse 73   Ht 5\' 7"  (1.702 m)   Wt 204 lb 2 oz (92.6 kg)   LMP 05/03/2016   SpO2 98%   BMI 31.97 kg/m  , BMI Body mass index is 31.97 kg/m. GEN: Well nourished, well developed, in no acute distress HEENT: normal Neck: no JVD, carotid bruits, or masses Cardiac: RRR; no murmurs, rubs, or gallops,no edema  Respiratory:  clear to auscultation bilaterally, normal work of breathing GI: soft, nontender, nondistended, + BS MS: no deformity or atrophy Skin: warm and dry, no rash Neuro:  Strength and sensation are intact Psych: euthymic mood, full affect   Recent Labs: No results found for requested labs within last 8760 hours.    Lipid Panel No results found for: CHOL, HDL, LDLCALC, TRIG    Wt Readings from Last 3 Encounters:  04/10/22 204 lb 2 oz (92.6 kg)  02/25/19 212 lb 8.4 oz (96.4 kg)  02/18/19 213 lb (96.6 kg)       ASSESSMENT AND PLAN:  Problem List Items Addressed This Visit   None Visit Diagnoses     Palpitations    -  Primary   Relevant Orders   EKG 12-Lead   Abnormal EKG       Chest discomfort          Palpitations Likely ectopy, low blood pressure limiting use of beta-blockers If symptoms get worse recommend she call our office, we could order a Zio monitor She currently reports minimal symptoms Less likely an arrhythmia of concern  Left chest discomfort Atypical in nature, likely musculoskeletal, symptoms at rest No clinical features concerning for angina Essentially normal EKG For any worsening symptoms risk stratification study with CT coronary calcium score should be performed Prior CT chest from 2016 no coronary calcification or aortic atherosclerosis noted  Paravertebral muscle spasms Tight  muscles left posterior shoulder Recommended stretching, massage, watch ergonomics, supportive pillow  Abnormal EKG No significant findings, does not quite meet criteria for LVH No other acute findings Actually appears better compared to prior study  july 2013  Essential hypertension Blood pressure running low, recommend she cut the HCTZ and potassium in half daily Losing weight with Ozempic likely contributing to lower pressures Recommend she consider checking blood pressure at home, may be able to come off the HCTZ and potassium   Total encounter time more than 60 minutes  Greater than 50% was spent in counseling and coordination of care with the patient  Patient seen in consultation for Dr. August 2013 and be referred back to his office for ongoing care of the issues detailed above   Signed, Clearance Coots, M.D., Ph.D. [facility] [place], [place]

## 2022-04-10 ENCOUNTER — Ambulatory Visit: Payer: BC Managed Care – PPO | Admitting: Cardiovascular Disease

## 2022-04-10 ENCOUNTER — Encounter: Payer: Self-pay | Admitting: Cardiovascular Disease

## 2022-04-10 VITALS — BP 110/80 | HR 73 | Ht 67.0 in | Wt 204.1 lb

## 2022-04-10 DIAGNOSIS — R9431 Abnormal electrocardiogram [ECG] [EKG]: Secondary | ICD-10-CM | POA: Diagnosis not present

## 2022-04-10 DIAGNOSIS — R002 Palpitations: Secondary | ICD-10-CM | POA: Diagnosis not present

## 2022-04-10 DIAGNOSIS — R0789 Other chest pain: Secondary | ICD-10-CM | POA: Diagnosis not present

## 2022-04-10 NOTE — Patient Instructions (Signed)
Medication Instructions:  No changes  If you need a refill on your cardiac medications before your next appointment, please call your pharmacy.   Lab work: No new labs needed  Testing/Procedures: No new testing needed  Follow-Up: At [facility], you and your health needs are our priority.  As part of our continuing mission to provide you with exceptional heart care, we have created designated Provider Care Teams.  These Care Teams include your primary Cardiologist (physician) and Advanced Practice Providers (APPs -  Physician Assistants and Nurse Practitioners) who all work together to provide you with the care you need, when you need it.  You will need a follow up appointment as needed  Providers on your designated Care Team:   Christopher Berge, NP Ryan Dunn, PA-C Cadence Furth, PA-C  COVID-19 Vaccine Information can be found at: [email or web address] For questions related to vaccine distribution or appointments, please email [email or web address] or call [phone].    

## 2022-04-22 ENCOUNTER — Ambulatory Visit: Payer: BC Managed Care – PPO | Admitting: Cardiology

## 2022-04-23 ENCOUNTER — Ambulatory Visit: Payer: BC Managed Care – PPO | Admitting: Cardiovascular Disease

## 2022-05-06 ENCOUNTER — Ambulatory Visit: Payer: BC Managed Care – PPO | Admitting: Cardiovascular Disease

## 2022-05-27 ENCOUNTER — Other Ambulatory Visit: Payer: Self-pay | Admitting: Family Medicine

## 2022-05-27 DIAGNOSIS — Z1231 Encounter for screening mammogram for malignant neoplasm of breast: Secondary | ICD-10-CM

## 2022-05-28 ENCOUNTER — Ambulatory Visit
Admission: RE | Admit: 2022-05-28 | Discharge: 2022-05-28 | Disposition: A | Discharge status: Home / Self Care | Payer: BC Managed Care – PPO | Source: Ambulatory Visit | Attending: Family Medicine | Admitting: Family Medicine

## 2022-05-28 DIAGNOSIS — Z1231 Encounter for screening mammogram for malignant neoplasm of breast: Secondary | ICD-10-CM

## 2022-06-07 ENCOUNTER — Other Ambulatory Visit: Payer: Self-pay

## 2022-06-07 ENCOUNTER — Emergency Department
Admission: EM | Admit: 2022-06-07 | Discharge: 2022-06-08 | Disposition: A | Discharge status: Home / Self Care | Payer: BC Managed Care – PPO | Attending: Emergency Medicine | Admitting: Emergency Medicine

## 2022-06-07 DIAGNOSIS — E876 Hypokalemia: Secondary | ICD-10-CM | POA: Insufficient documentation

## 2022-06-07 DIAGNOSIS — F10929 Alcohol use, unspecified with intoxication, unspecified: Secondary | ICD-10-CM

## 2022-06-07 DIAGNOSIS — F10129 Alcohol abuse with intoxication, unspecified: Secondary | ICD-10-CM | POA: Diagnosis present

## 2022-06-07 DIAGNOSIS — Y907 Blood alcohol level of 200-239 mg/100 ml: Secondary | ICD-10-CM | POA: Insufficient documentation

## 2022-06-07 DIAGNOSIS — R112 Nausea with vomiting, unspecified: Secondary | ICD-10-CM

## 2022-06-07 NOTE — ED Triage Notes (Signed)
Pt found intoxicated and sleeping in parking lot of a bar. Pt wakes to loud repeated verbal stimuli and occasional need for painful central stimulation. Pt reports "drinking a lot" and denies drug use. Emesis noted on arrival.

## 2022-06-08 LAB — CBC WITH DIFFERENTIAL/PLATELET
Abs Immature Granulocytes: 0.02 10*3/uL (ref 0.00–0.07)
Basophils Absolute: 0.1 10*3/uL (ref 0.0–0.1)
Basophils Relative: 1 %
Eosinophils Absolute: 0.5 10*3/uL (ref 0.0–0.5)
Eosinophils Relative: 6 %
HCT: 36.6 % (ref 36.0–46.0)
Hemoglobin: 12 g/dL (ref 12.0–15.0)
Immature Granulocytes: 0 %
Lymphocytes Relative: 54 %
Lymphs Abs: 4.8 10*3/uL — ABNORMAL HIGH (ref 0.7–4.0)
MCH: 30.9 pg (ref 26.0–34.0)
MCHC: 32.8 g/dL (ref 30.0–36.0)
MCV: 94.3 fL (ref 80.0–100.0)
Monocytes Absolute: 0.5 10*3/uL (ref 0.1–1.0)
Monocytes Relative: 5 %
Neutro Abs: 3 10*3/uL (ref 1.7–7.7)
Neutrophils Relative %: 34 %
Platelets: 402 10*3/uL — ABNORMAL HIGH (ref 150–400)
RBC: 3.88 MIL/uL (ref 3.87–5.11)
RDW: 12.6 % (ref 11.5–15.5)
WBC: 9 10*3/uL (ref 4.0–10.5)
nRBC: 0 % (ref 0.0–0.2)

## 2022-06-08 LAB — BASIC METABOLIC PANEL
Anion gap: 12 (ref 5–15)
BUN: 17 mg/dL (ref 6–20)
CO2: 21 mmol/L — ABNORMAL LOW (ref 22–32)
Calcium: 9.5 mg/dL (ref 8.9–10.3)
Chloride: 107 mmol/L (ref 98–111)
Creatinine, Ser: 0.8 mg/dL (ref 0.44–1.00)
GFR, Estimated: >60 mL/min (ref >60)
Glucose, Bld: 167 mg/dL — ABNORMAL HIGH (ref 70–99)
Potassium: 2.5 mmol/L — CL (ref 3.5–5.1)
Sodium: 140 mmol/L (ref 135–145)

## 2022-06-08 LAB — ETHANOL: Alcohol, Ethyl (B): 201 mg/dL — ABNORMAL HIGH (ref <10)

## 2022-06-08 MED ORDER — ONDANSETRON HCL 4 MG/2ML IJ SOLN
4.0000 mg | INTRAMUSCULAR | Status: DC
  Filled 2022-06-08: qty 2

## 2022-06-08 MED ORDER — POTASSIUM CHLORIDE 10 MEQ/100ML IV SOLN
10.0000 meq | Freq: Once | INTRAVENOUS | Status: AC
  Administered 2022-06-08: 10 meq via INTRAVENOUS
  Filled 2022-06-08: qty 100

## 2022-06-08 MED ORDER — ONDANSETRON 4 MG PO TBDP: ORAL_TABLET | ORAL | 0 refills | Status: AC

## 2022-06-08 MED ORDER — MAGNESIUM SULFATE 2 GM/50ML IV SOLN
2.0000 g | Freq: Once | INTRAVENOUS | Status: AC
  Administered 2022-06-08: 2 g via INTRAVENOUS
  Filled 2022-06-08: qty 50

## 2022-06-08 MED ORDER — POTASSIUM CHLORIDE CRYS ER 20 MEQ PO TBCR: 20.0000 meq | EXTENDED_RELEASE_TABLET | Freq: Every day | ORAL | 0 refills | Status: AC

## 2022-06-08 MED ORDER — POTASSIUM CHLORIDE CRYS ER 20 MEQ PO TBCR
40.0000 meq | EXTENDED_RELEASE_TABLET | Freq: Once | ORAL | Status: AC
  Administered 2022-06-08: 40 meq via ORAL
  Filled 2022-06-08: qty 2

## 2022-06-08 NOTE — Discharge Instructions (Addendum)
You were seen in the emergency department for alcohol intoxication.  Please seek help from the recommended resources for assistance if you feel like you have issues with alcohol dependence.  If you have any thoughts of hurting herself or others, please call 911 or return to the emergency department.  Please avoid drug and alcohol use.  Never drive a vehicle or operate machinery while intoxicated.

## 2022-06-08 NOTE — ED Notes (Signed)
Pt passed PO challenge with crackers and gingerale. Pt requesting to go home and is able to ambulate independently.

## 2022-06-08 NOTE — ED Provider Notes (Signed)
[facility] Provider Note    Event Date/Time   First MD Initiated Contact with Patient 06/08/22 0003     (approximate)   History   Alcohol Intoxication   HPI  Level 5 caveat:  history/ROS limited by acute intoxication  Whitney Nichols is a 61 y.o. female brought by EMS for acute intoxication.  She was found sleeping in a parking lot of a bar.  She awakens to loud verbal stimuli and to painful physical stimuli.  Her speech is slurred and she reports drinking "a lot" she denies any drug use.  She denies any trauma.  She has vomited at least once and possibly multiple times.  No respiratory difficulty.     Physical Exam   Triage Vital Signs: ED Triage Vitals  Enc Vitals Group     BP 06/07/22 2359 (!) 128/95     Pulse Rate 06/07/22 2359 74     Resp 06/07/22 2359 16     Temp 06/07/22 2359 97.8 F (36.6 C)     Temp Source 06/07/22 2359 Oral     SpO2 06/07/22 2359 100 %     Weight 06/07/22 2357 93.9 kg (207 lb)     Height 06/07/22 2357 1.702 m (5\' 7" )     Head Circumference --      Peak Flow --      Pain Score 06/07/22 2357 0     Pain Loc --      Pain Edu? --      Excl. in [place]? --     Most recent vital signs: Vitals:   06/07/22 2359 06/08/22 0242  BP: (!) 128/95 116/81  Pulse: 74 83  Resp: 16 17  Temp: 97.8 F (36.6 C)   SpO2: 100% 97%     General: Awake, appears heavily intoxicated but is not in distress. CV:  Good peripheral perfusion.  Normal heart sounds. Resp:  Normal effort.  No accessory muscle usage, lungs are clear to auscultation. Abd:  No distention.  No tenderness to palpation. Other:  No obvious trauma.  Patient is heavily intoxicated but protecting her airway and responsive to painful stimuli and loud verbal stimuli.   ED Results / Procedures / Treatments   Labs (all labs ordered are listed, but only abnormal results are displayed) Labs Reviewed  ETHANOL - Abnormal; Notable for the following components:      Result  Value   Alcohol, Ethyl (B) 201 (*)    All other components within normal limits  CBC WITH DIFFERENTIAL/PLATELET - Abnormal; Notable for the following components:   Platelets 402 (*)    Lymphs Abs 4.8 (*)    All other components within normal limits  BASIC METABOLIC PANEL - Abnormal; Notable for the following components:   Potassium 2.5 (*)    CO2 21 (*)    Glucose, Bld 167 (*)    All other components within normal limits     PROCEDURES:  Critical Care performed: No  Procedures   MEDICATIONS ORDERED IN ED: Medications  ondansetron (ZOFRAN) injection 4 mg (has no administration in time range)  magnesium sulfate IVPB 2 g 50 mL (0 g Intravenous Stopped 06/08/22 0241)  potassium chloride SA (KLOR-CON M) CR tablet 40 mEq (40 mEq Oral Given 06/08/22 0137)  potassium chloride 10 mEq in 100 mL IVPB (0 mEq Intravenous Stopped 06/08/22 0234)     IMPRESSION / MDM / ASSESSMENT AND PLAN / ED COURSE  I reviewed the triage vital signs and the  nursing notes.                              Differential diagnosis includes, but is not limited to, alcohol intoxication, other nonspecific drug abuse, trauma, electrolyte or metabolic abnormality.  Patient's presentation is most consistent with acute presentation with potential threat to life or bodily function.  The main concern is airway protection and possible aspiration.  Patient admits to heavy alcohol intoxication was found sleeping in the parking lot of a bar.  There is no sign of trauma at this point.  She needs monitoring for airway protection and possible aspiration, but at this point does not require aggressive intervention.  Labs ordered include ethanol level, BMP, and CBC with differential.  CBC is essentially normal and ethanol level is 201.  Patient is on continuous pulse oximetry and is in no distress at this time.  I ordered Zofran 4 mg IV for her recent emesis and probable ongoing nausea.   Clinical Course as of 06/08/22 0431  Sat Jun 08, 2022  0054 Potassium(!!): 2.5 Potassium is quite low at 2.5.  Ordering 40 mill equivalents by mouth, 10 mill equivalents by IV, and 2 g of magnesium IV to help with potassium reabsorption. [CF]  [phone] I had 2 extensive conversations with the patient and with 2 of her family members in person and 1 family member by phone after the patient gave permission to do so.  They are very concerned about possible toxicology, including the possibility that the patient may have taken something to harm herself.  However the patient is denying SI and HI and denies taking anything other than alcohol.  I gave her the opportunity to check a urine drug screen but she declines.  I explained that there is no indication that she has done this to herself and that her alcohol level was nearly 3 times the legal limit, which is very adequate for the kind of presentation she is exhibiting.  I recommended that they talk amongst themselves and I will provide outpatient resources for follow-up both for counseling and for substance abuse issues.  They all agreed with that plan. [CF]  [phone] Patient is still little bit sleepy but is awake and alert and was able to ambulate without requiring any assistance and was steady of gait.  I will write her prescription for potassium supplements and provide outpatient resources for substance abuse issues. [CF]    Clinical Course User Index [CF] Loleta Rose, MD     FINAL CLINICAL IMPRESSION(S) / ED DIAGNOSES   Final diagnoses:  Alcoholic intoxication with complication (HCC)  Hypokalemia  Nausea and vomiting, unspecified vomiting type     Rx / DC Orders   ED Discharge Orders          Ordered    ondansetron (ZOFRAN-ODT) 4 MG disintegrating tablet        06/08/22 0430    potassium chloride SA (KLOR-CON M20) 20 MEQ tablet  Daily        06/08/22 0430             Note:  This document was prepared using Dragon voice recognition software and may include unintentional dictation  errors.   Loleta Rose, MD 06/08/22 [phone]

## 2022-06-08 NOTE — ED Notes (Signed)
Patient discharged at this time. Wheeled to lobby with family who are providing sober ride. Breathing unlabored speaking in full sentences. Verbalized understanding of all discharge, follow up, and medication teaching. Discharged homed with all belongings.

## 2022-11-05 ENCOUNTER — Encounter: Payer: Self-pay | Admitting: Emergency Medicine

## 2022-11-05 ENCOUNTER — Other Ambulatory Visit: Payer: Self-pay

## 2022-11-05 ENCOUNTER — Emergency Department
Admission: EM | Admit: 2022-11-05 | Discharge: 2022-11-05 | Disposition: A | Discharge status: Home / Self Care | Payer: BC Managed Care – PPO | Attending: Emergency Medicine | Admitting: Emergency Medicine

## 2022-11-05 DIAGNOSIS — Z20822 Contact with and (suspected) exposure to covid-19: Secondary | ICD-10-CM | POA: Diagnosis not present

## 2022-11-05 DIAGNOSIS — R059 Cough, unspecified: Secondary | ICD-10-CM | POA: Diagnosis present

## 2022-11-05 DIAGNOSIS — J101 Influenza due to other identified influenza virus with other respiratory manifestations: Secondary | ICD-10-CM | POA: Diagnosis not present

## 2022-11-05 LAB — RESP PANEL BY RT-PCR (RSV, FLU A&B, COVID)  RVPGX2
Influenza A by PCR: POSITIVE — AB
Influenza B by PCR: NEGATIVE
Resp Syncytial Virus by PCR: NEGATIVE
SARS Coronavirus 2 by RT PCR: NEGATIVE

## 2022-11-05 MED ORDER — KETOROLAC TROMETHAMINE 30 MG/ML IJ SOLN
30.0000 mg | Freq: Once | INTRAMUSCULAR | Status: DC
  Filled 2022-11-05: qty 1

## 2022-11-05 MED ORDER — KETOROLAC TROMETHAMINE 15 MG/ML IJ SOLN
15.0000 mg | Freq: Once | INTRAMUSCULAR | Status: AC
  Administered 2022-11-05: 15 mg via INTRAVENOUS
  Filled 2022-11-05: qty 1

## 2022-11-05 NOTE — ED Provider Notes (Signed)
   [facility] Provider Note    Event Date/Time   First MD Initiated Contact with Patient 11/05/22 1055     (approximate)   History   Generalized Body Aches, Cough, and Nasal Congestion   HPI  Whitney Nichols is a 62 y.o. female who presents with complaints of bodyaches, mild cough, nasal congestion     Physical Exam   Triage Vital Signs: ED Triage Vitals  Enc Vitals Group     BP 11/05/22 0953 132/86     Pulse Rate 11/05/22 0953 (!) 110     Resp 11/05/22 0953 18     Temp 11/05/22 0953 99.4 F (37.4 C)     Temp Source 11/05/22 0953 Oral     SpO2 11/05/22 0953 95 %     Weight 11/05/22 0954 90.7 kg (200 lb)     Height 11/05/22 0954 1.702 m (5\' 7" )     Head Circumference --      Peak Flow --      Pain Score 11/05/22 0953 6     Pain Loc --      Pain Edu? --      Excl. in [place]? --     Most recent vital signs: Vitals:   11/05/22 0953 11/05/22 1122  BP: 132/86   Pulse: (!) 110 88  Resp: 18 17  Temp: 99.4 F (37.4 C) 98.6 F (37 C)  SpO2: 95% 96%     General: Awake, no distress.  CV:  Good peripheral perfusion.  Resp:  Normal effort.  Clear to auscultation bilaterally Abd:  No distention.  Other:     ED Results / Procedures / Treatments   Labs (all labs ordered are listed, but only abnormal results are displayed) Labs Reviewed  RESP PANEL BY RT-PCR (RSV, FLU A&B, COVID)  RVPGX2 - Abnormal; Notable for the following components:      Result Value   Influenza A by PCR POSITIVE (*)    All other components within normal limits     EKG     RADIOLOGY     PROCEDURES:  Critical Care performed:   Procedures   MEDICATIONS ORDERED IN ED: Medications  ketorolac (TORADOL) 15 MG/ML injection 15 mg (15 mg Intravenous Given 11/05/22 1121)     IMPRESSION / MDM / [place] / ED COURSE  I reviewed the triage vital signs and the nursing notes. Patient's presentation is most consistent with acute illness / injury with  system symptoms.   Patient presents with symptoms of upper respiratory infection, differential includes influenza, COVID, RSV, other viral illness, not consistent with pneumonia  Respiratory panel positive for influenza A, recommend supportive care, outpatient follow-up as needed       FINAL CLINICAL IMPRESSION(S) / ED DIAGNOSES   Final diagnoses:  Influenza A     Rx / DC Orders   ED Discharge Orders     None        Note:  This document was prepared using Dragon voice recognition software and may include unintentional dictation errors.   Lavonia Drafts, MD 11/05/22 1550

## 2022-11-05 NOTE — ED Triage Notes (Signed)
Pt from home via ems reports generalized body aches, productive cough and headache that began Saturday. Pt also reports low grade fever.

## 2022-12-08 IMAGING — US US EXTREM LOW VENOUS*L*
1 series · 14 of 24 positions shown · non-contrast
Comparison: None.

CLINICAL DATA: Pain for 4 days

EXAM:
LEFT LOWER EXTREMITY VENOUS DOPPLER ULTRASOUND
TECHNIQUE: Gray-scale sonography with compression, as well as color and duplex
ultrasound, were performed to evaluate the deep venous system(s)
from the level of the common femoral vein through the popliteal and
proximal calf veins.

[Series 1: us extrem low venous*left* · 0.07mm/px · 14 of 34 slices shown]
[im 1/34]
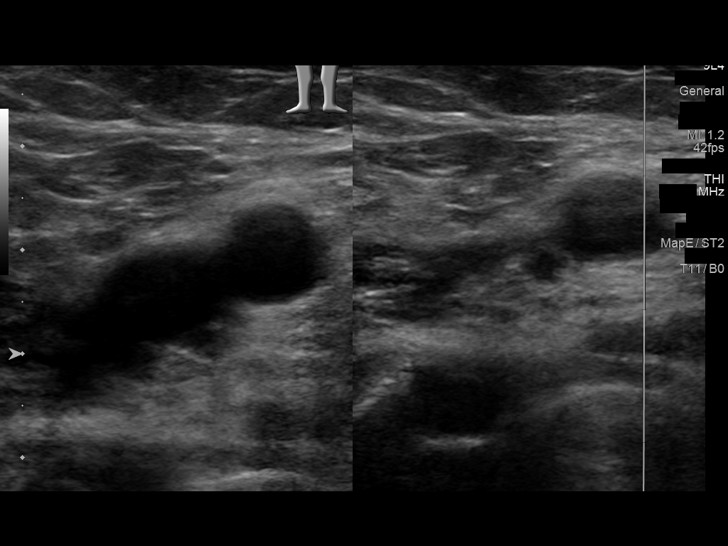
[im 3/34]
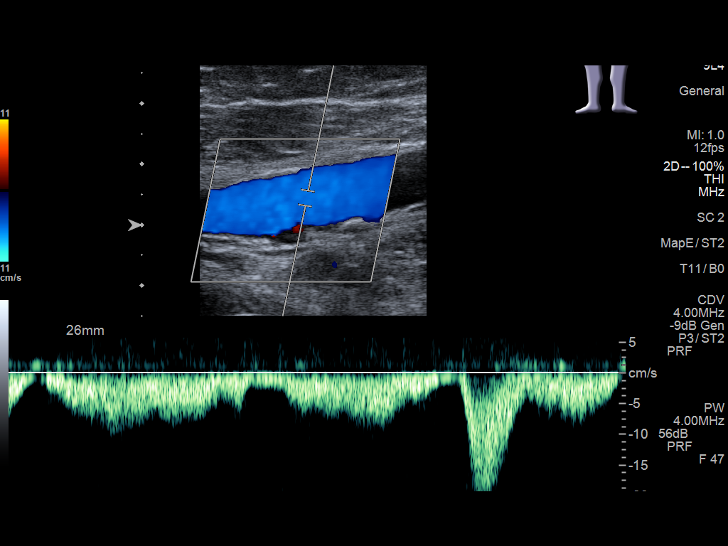
[im 6/34]
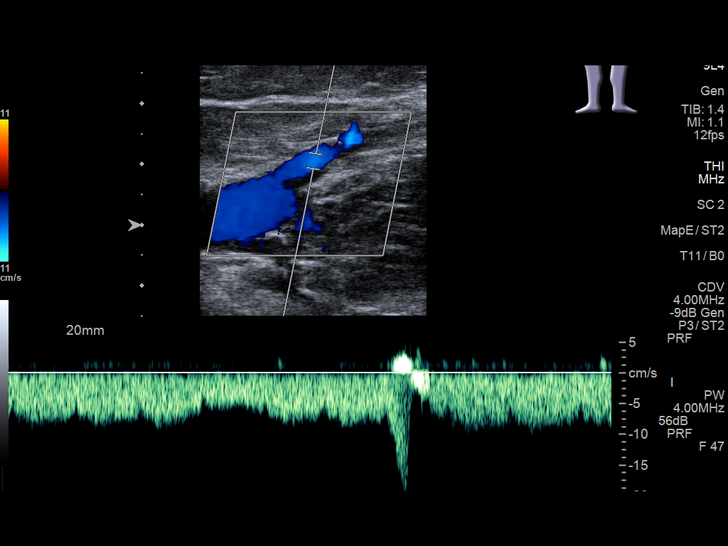
[im 9/34]
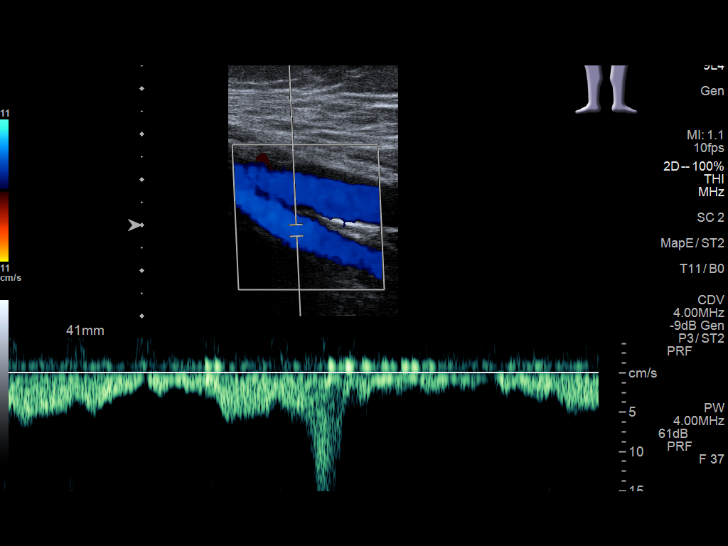
[im 11/34]
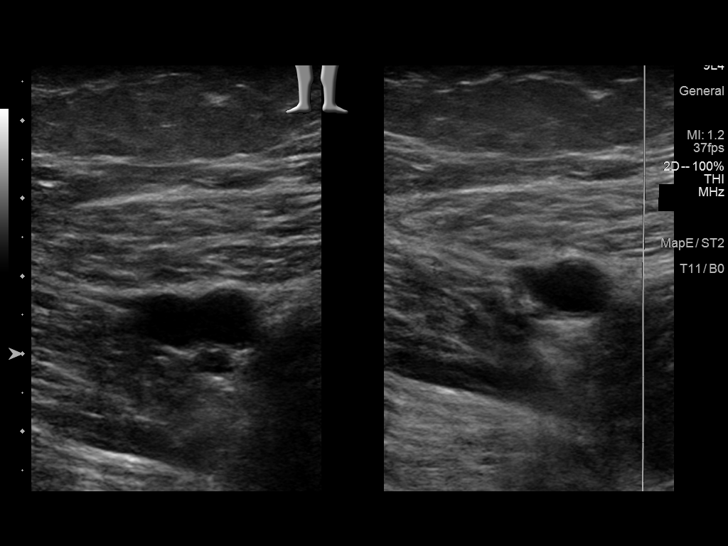
[im 13/34]
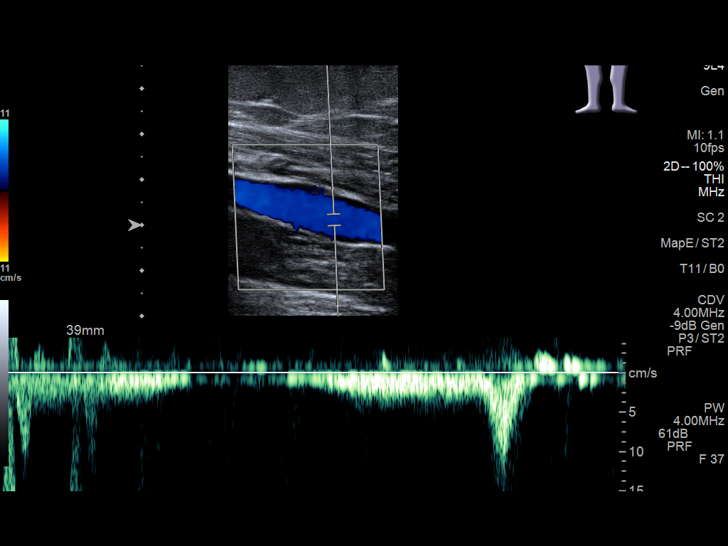
[im 16/34]
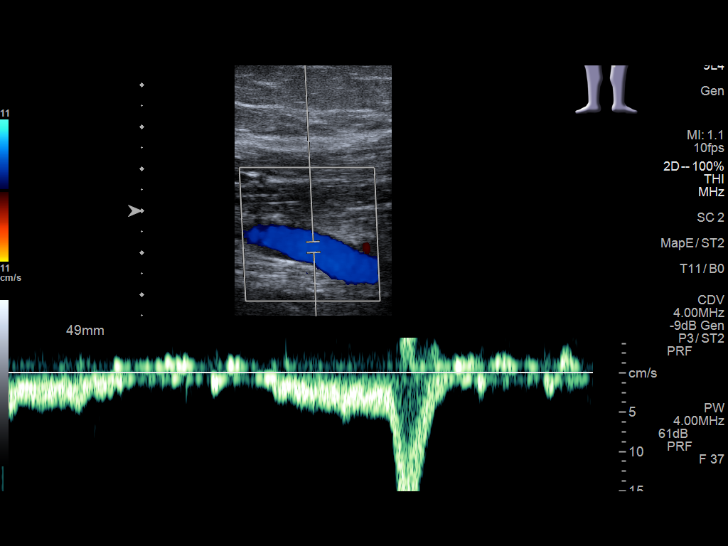
[im 18/34]
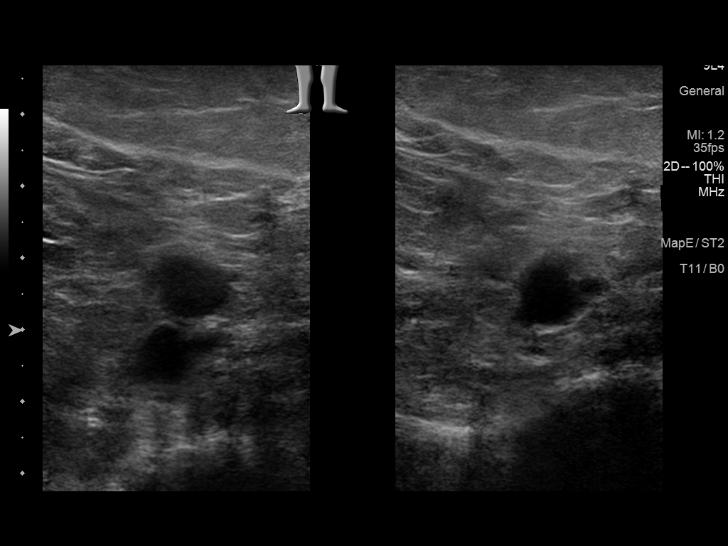
[im 21/34]
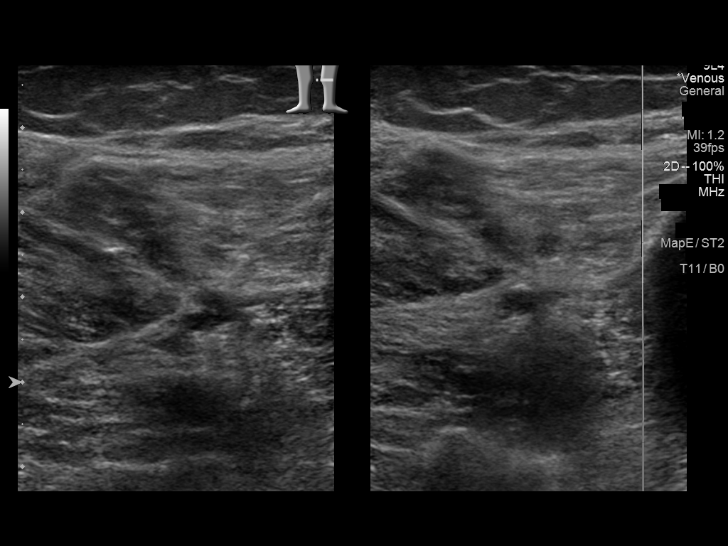
[im 23/34]
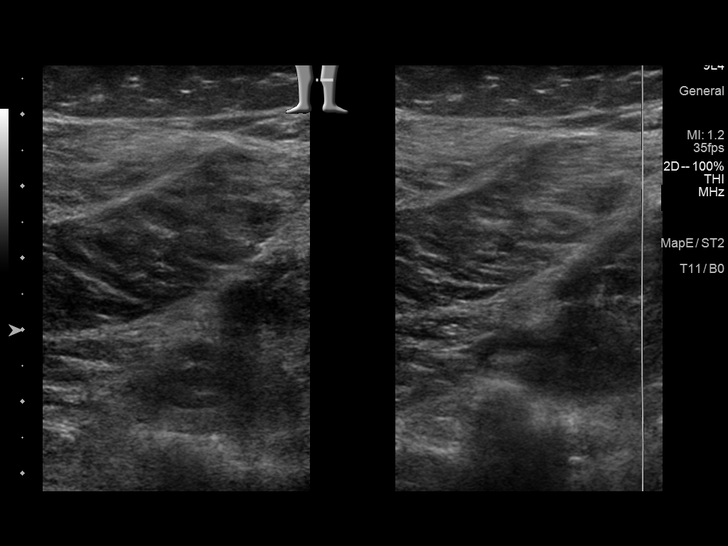
[im 26/34]
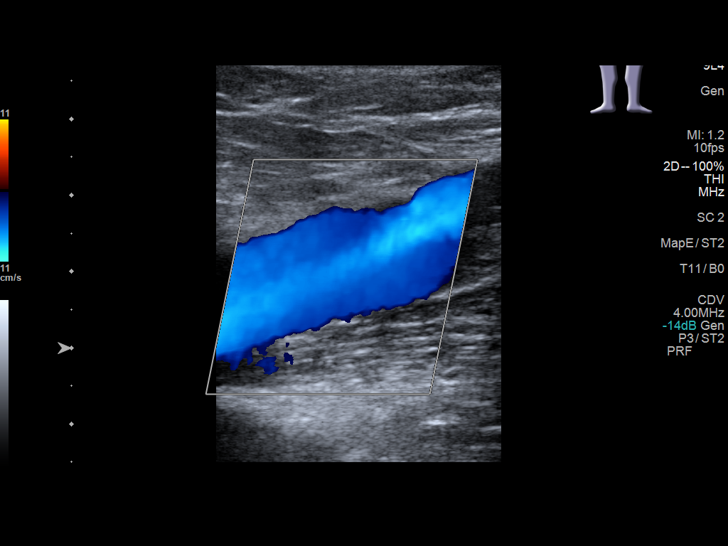
[im 28/34]
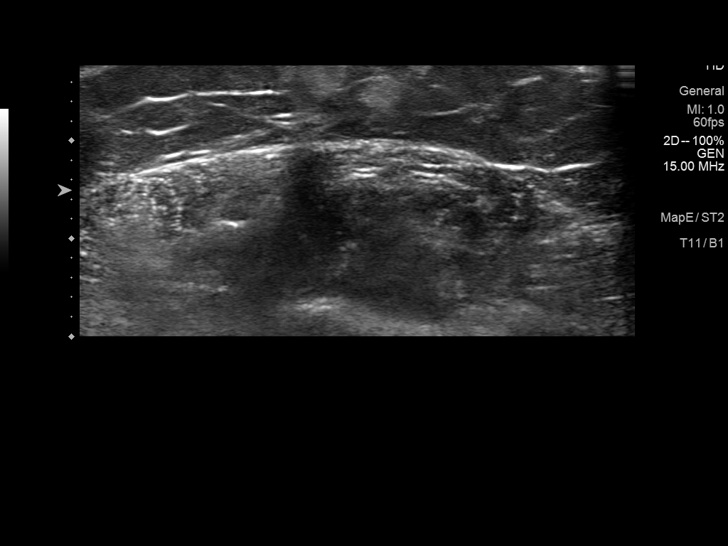
[im 31/34]
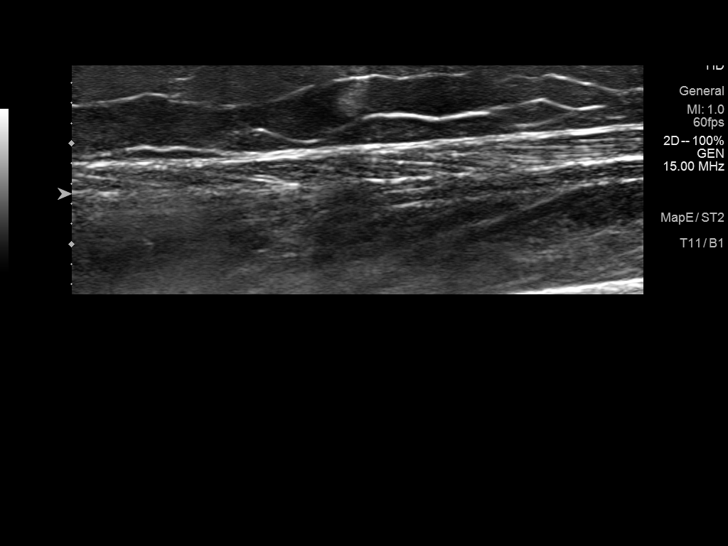
[im 34/34]
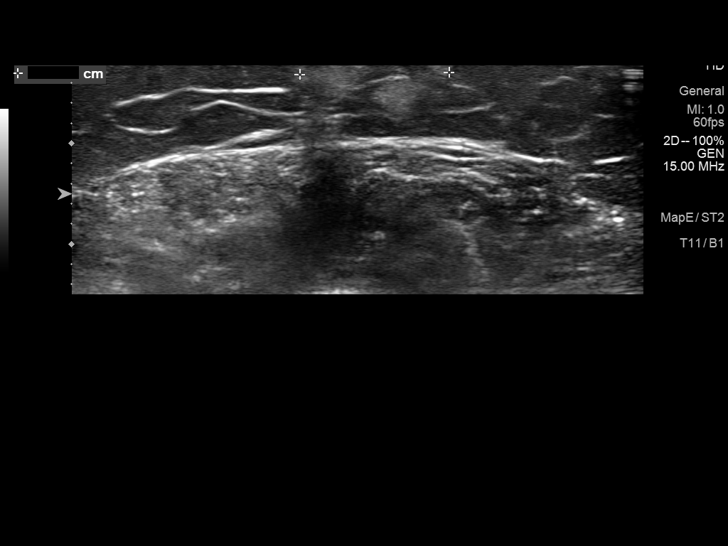

[14 of 24 positions shown; findings below may reference images not displayed]

FINDINGS: VENOUS

Normal compressibility of the common femoral, superficial femoral,
and popliteal veins, as well as the visualized calf veins.
Visualized portions of profunda femoral vein and great saphenous
vein unremarkable. No filling defects to suggest DVT on grayscale or
color Doppler imaging. Doppler waveforms show normal direction of
venous flow, normal respiratory plasticity and response to
augmentation.

Limited views of the contralateral common femoral vein are
unremarkable.

OTHER

Targeted sonographic evaluation of the area of bruising in the left
lateral mid shin demonstrates mild heterogeneous hyperechoic soft
tissue. Findings are nonspecific but most likely due to minimal
hemorrhage.

Limitations: none
IMPRESSION: No left lower extremity DVT

## 2023-11-29 ENCOUNTER — Other Ambulatory Visit: Payer: Self-pay

## 2023-11-29 ENCOUNTER — Emergency Department
Admission: EM | Admit: 2023-11-29 | Discharge: 2023-11-29 | Disposition: A | Discharge status: Home / Self Care | Payer: 59 | Attending: Emergency Medicine | Admitting: Emergency Medicine

## 2023-11-29 ENCOUNTER — Emergency Department: Payer: 59

## 2023-11-29 ENCOUNTER — Encounter: Payer: Self-pay | Admitting: Intensive Care

## 2023-11-29 DIAGNOSIS — R110 Nausea: Secondary | ICD-10-CM | POA: Diagnosis not present

## 2023-11-29 DIAGNOSIS — R1013 Epigastric pain: Secondary | ICD-10-CM | POA: Diagnosis not present

## 2023-11-29 DIAGNOSIS — R1012 Left upper quadrant pain: Secondary | ICD-10-CM | POA: Diagnosis present

## 2023-11-29 DIAGNOSIS — I10 Essential (primary) hypertension: Secondary | ICD-10-CM | POA: Diagnosis not present

## 2023-11-29 DIAGNOSIS — R079 Chest pain, unspecified: Secondary | ICD-10-CM | POA: Diagnosis not present

## 2023-11-29 HISTORY — DX: Depression, unspecified: F32.A

## 2023-11-29 LAB — HEPATIC FUNCTION PANEL
ALT: 23 U/L (ref 0–44)
AST: 22 U/L (ref 15–41)
Albumin: 4.2 g/dL (ref 3.5–5.0)
Alkaline Phosphatase: 58 U/L (ref 38–126)
Bilirubin, Direct: <0.1 mg/dL (ref 0.0–0.2)
Total Bilirubin: 0.6 mg/dL (ref 0.0–1.2)
Total Protein: 7.6 g/dL (ref 6.5–8.1)

## 2023-11-29 LAB — CBC
HCT: 36.8 % (ref 36.0–46.0)
Hemoglobin: 11.9 g/dL — ABNORMAL LOW (ref 12.0–15.0)
MCH: 32.1 pg (ref 26.0–34.0)
MCHC: 32.3 g/dL (ref 30.0–36.0)
MCV: 99.2 fL (ref 80.0–100.0)
Platelets: 318 10*3/uL (ref 150–400)
RBC: 3.71 MIL/uL — ABNORMAL LOW (ref 3.87–5.11)
RDW: 12.5 % (ref 11.5–15.5)
WBC: 6.3 10*3/uL (ref 4.0–10.5)
nRBC: 0 % (ref 0.0–0.2)

## 2023-11-29 LAB — BASIC METABOLIC PANEL
Anion gap: 12 (ref 5–15)
BUN: 19 mg/dL (ref 6–20)
CO2: 27 mmol/L (ref 22–32)
Calcium: 9.5 mg/dL (ref 8.9–10.3)
Chloride: 104 mmol/L (ref 98–111)
Creatinine, Ser: 0.77 mg/dL (ref 0.44–1.00)
GFR, Estimated: >60 mL/min (ref >60)
Glucose, Bld: 64 mg/dL — ABNORMAL LOW (ref 70–99)
Potassium: 3.6 mmol/L (ref 3.5–5.1)
Sodium: 143 mmol/L (ref 135–145)

## 2023-11-29 LAB — TROPONIN I (HIGH SENSITIVITY)
Troponin I (High Sensitivity): 3 ng/L (ref <18)
Troponin I (High Sensitivity): 3 ng/L (ref <18)

## 2023-11-29 LAB — LIPASE, BLOOD: Lipase: 38 U/L (ref 11–51)

## 2023-11-29 MED ORDER — LIDOCAINE VISCOUS HCL 2 % MT SOLN
15.0000 mL | Freq: Once | OROMUCOSAL | Status: AC
  Administered 2023-11-29: 15 mL via ORAL
  Filled 2023-11-29: qty 15

## 2023-11-29 MED ORDER — ALUM & MAG HYDROXIDE-SIMETH 200-200-20 MG/5ML PO SUSP
30.0000 mL | Freq: Once | ORAL | Status: AC
  Administered 2023-11-29: 30 mL via ORAL
  Filled 2023-11-29: qty 30

## 2023-11-29 MED ORDER — ASPIRIN 325 MG PO TABS: 325.0000 mg | ORAL_TABLET | Freq: Once | ORAL | Status: DC

## 2023-11-29 NOTE — ED Provider Triage Note (Signed)
Emergency Medicine Provider Triage Evaluation Note  Whitney Nichols , a 63 y.o. female  was evaluated in triage.  Pt complains of chest pain that she first noticed in the shower.  It is her left anterior chest.  It is worse with truncal rotation or pushing on the area.  She is unsure if she pulled a muscle.  She denies cough or shortness of breath.  No history of blood clot.  Denies history of cardiac disease.   Review of Systems  Positive: CP Negative: SOB, n/v/d  Physical Exam  Ht 5\' 7"  (1.702 m)   Wt 95.3 kg   LMP 05/03/2016   BMI 32.89 kg/m  Gen:   Awake, no distress   Resp:  Normal effort  MSK:   Moves extremities without difficulty  Other:    Medical Decision Making  Medically screening exam initiated at 4:55 PM.  Appropriate orders placed.  Reem Fleury was informed that the remainder of the evaluation will be completed by another provider, this initial triage assessment does not replace that evaluation, and the importance of remaining in the ED until their evaluation is complete.     Jackelyn Hoehn, PA-C 11/29/23 1657

## 2023-11-29 NOTE — ED Triage Notes (Signed)
Patient c/o left sided chest pain since Thursday 11/27/23. Reports pressure with intermittent sharp pain. Reports radiation to back. C/o some nausea and sweating

## 2023-11-29 NOTE — ED Provider Notes (Signed)
[facility] Provider Note    Event Date/Time   First MD Initiated Contact with Patient 11/29/23 2038     (approximate)   History   Chest Pain   HPI  Whitney Nichols is a 63 y.o. female hypertension, MDD, esophagitis presenting with chest pain and epigastric and left upper quadrant abdominal pain.  Associated with some nausea.  States that she thinks that she pulled it.  States that it feels like a knot in her chest that goes to her back.  Has been ongoing since Thursday.  She denies any personal cardiac history.  Did say her mom passed away from an MI.  No history of CHF, no lower extremity edema.  She denies any cough or fever.  No vomiting or diarrhea.  No urinary symptoms.   On independent chart review she was seen by primary care doctor on the 23rd, had some of her medications adjusted.  She had a stress test done in 2010 that was negative.  Physical Exam   Triage Vital Signs: ED Triage Vitals  Encounter Vitals Group     BP 11/29/23 1657 (!) 135/91     Systolic BP Percentile --      Diastolic BP Percentile --      Pulse Rate 11/29/23 1657 79     Resp 11/29/23 1657 16     Temp 11/29/23 1657 98.3 F (36.8 C)     Temp Source 11/29/23 1657 Oral     SpO2 11/29/23 1657 100 %     Weight 11/29/23 1653 210 lb (95.3 kg)     Height 11/29/23 1653 5\' 7"  (1.702 m)     Head Circumference --      Peak Flow --      Pain Score 11/29/23 1653 8     Pain Loc --      Pain Education --      Exclude from Growth Chart --     Most recent vital signs: Vitals:   11/29/23 1657 11/29/23 2138  BP: (!) 135/91 (!) 139/95  Pulse: 79 68  Resp: 16 18  Temp: 98.3 F (36.8 C) 97.7 F (36.5 C)  SpO2: 100% 99%     General: Awake, no distress.  CV:  Good peripheral perfusion.  Mild tenderness to her lower rib cage on the left Resp:  Normal effort.  Clear to auscultation Abd:  No distention.  Mildly tender to the epigastrium left upper quadrant, no right upper or  lower quadrant tenderness to palpation Other:  She has equal radial pulses bilaterally, no focal weakness or numbness.   ED Results / Procedures / Treatments   Labs (all labs ordered are listed, but only abnormal results are displayed) Labs Reviewed  BASIC METABOLIC PANEL - Abnormal; Notable for the following components:      Result Value   Glucose, Bld 64 (*)    All other components within normal limits  CBC - Abnormal; Notable for the following components:   RBC 3.71 (*)    Hemoglobin 11.9 (*)    All other components within normal limits  HEPATIC FUNCTION PANEL  LIPASE, BLOOD  TROPONIN I (HIGH SENSITIVITY)  TROPONIN I (HIGH SENSITIVITY)     EKG  Sinus rhythm, rate 81, normal QS, normal QTc, T wave inversions to 3, aVF with some mild ST depressions, no ischemic ST elevation, T wave inversion in ST depressions do appear on prior EKGs.  Repeat EKG, sinus rhythm, rate 68, normal QRS, normal QTc,  no ischemic ST elevation, baseline is wandering but T wave versions are present in 3, aVF, minimal ST depressions 2 3 and aVF that are present on prior EKG.   RADIOLOGY Chest x-ray on my interpretation without focal consolidation   PROCEDURES:  Critical Care performed: No  Procedures   MEDICATIONS ORDERED IN ED: Medications  alum & mag hydroxide-simeth (MAALOX/MYLANTA) 200-200-20 MG/5ML suspension 30 mL (30 mLs Oral Given 11/29/23 2136)    And  lidocaine (XYLOCAINE) 2 % viscous mouth solution 15 mL (15 mLs Oral Given 11/29/23 2136)     IMPRESSION / MDM / ASSESSMENT AND PLAN / ED COURSE  I reviewed the triage vital signs and the nursing notes.                              Differential diagnosis includes, but is not limited to, ACS, muscle strain, GERD, gastritis, peptic ulcer disease, pancreatitis, electrolyte derangements.  Considered PE but she has no history of malignancy, no unilateral calf swelling or tenderness, no recent surgeries or travel, is not hypoxic.  Considered  dissection but she is not overly hypertensive, has reproducible chest pain, no pulse deficits, no neurodeficits.  Will get labs, EKG, troponin, chest x-ray, GI cocktail  Patient's presentation is most consistent with acute presentation with potential threat to life or bodily function.  On reassessment patient is feeling better after GI cocktail.  Independent review of labs, troponin x 2 is negative, LFTs are not elevated, electrolyte severely deranged, no leukocytosis.  Shared decision making done with patient and she is agreeable plan for discharge, will send a referral for cardiology follow-up, also instructed her to follow-up with her primary care doctor for further management.  Considered but no indication for inpatient admission this time, she is a for outpatient management.  Discharge.  She return precautions given.      FINAL CLINICAL IMPRESSION(S) / ED DIAGNOSES   Final diagnoses:  Chest pain, unspecified type  Epigastric pain  Left upper quadrant pain     Rx / DC Orders   ED Discharge Orders          Ordered    Ambulatory referral to Cardiology       Comments: If you have not heard from the Cardiology office within the next 72 hours please call [phone].   11/29/23 2242             Note:  This document was prepared using Dragon voice recognition software and may include unintentional dictation errors.    Claybon Jabs, MD 11/29/23 [phone]

## 2023-11-29 NOTE — Discharge Instructions (Signed)
Please return if you have recurrent or worsening symptoms, shortness of breath, lightheadedness, if you pass out, or if you have any additional concerns  Please follow-up with your primary care doctor for your symptoms.  I have placed a cardiology consult for you as well.

## 2024-11-03 ENCOUNTER — Other Ambulatory Visit: Payer: Self-pay | Admitting: Family Medicine

## 2024-11-03 DIAGNOSIS — Z1231 Encounter for screening mammogram for malignant neoplasm of breast: Secondary | ICD-10-CM

## 2024-11-18 ENCOUNTER — Ambulatory Visit

## 2024-11-18 ENCOUNTER — Ambulatory Visit
Admission: RE | Admit: 2024-11-18 | Discharge: 2024-11-18 | Disposition: A | Source: Ambulatory Visit | Attending: Family Medicine | Admitting: Family Medicine

## 2024-11-18 DIAGNOSIS — Z1231 Encounter for screening mammogram for malignant neoplasm of breast: Secondary | ICD-10-CM
